# Patient Record
Sex: Female | Born: 1965 | Race: White | Hispanic: No | Marital: Married | Smoking: Current every day smoker
Health system: Southern US, Community
[De-identification: ages and names within clinical notes are randomized; demographics above are authoritative.]

## PROBLEM LIST (undated history)

## (undated) DIAGNOSIS — I6523 Occlusion and stenosis of bilateral carotid arteries: Secondary | ICD-10-CM

## (undated) DIAGNOSIS — F429 Obsessive-compulsive disorder, unspecified: Secondary | ICD-10-CM

## (undated) HISTORY — DX: Obsessive-compulsive disorder, unspecified: F42.9

## (undated) HISTORY — PX: TONSILECTOMY, ADENOIDECTOMY, BILATERAL MYRINGOTOMY AND TUBES: SHX2538

## (undated) HISTORY — PX: APPENDECTOMY: SHX54

## (undated) HISTORY — PX: OTHER SURGICAL HISTORY: SHX169

## (undated) HISTORY — DX: Occlusion and stenosis of bilateral carotid arteries: I65.23

## (undated) HISTORY — PX: PARTIAL HYSTERECTOMY: SHX80

---

## 2016-03-10 DIAGNOSIS — M25559 Pain in unspecified hip: Secondary | ICD-10-CM | POA: Insufficient documentation

## 2016-03-10 DIAGNOSIS — M7071 Other bursitis of hip, right hip: Secondary | ICD-10-CM | POA: Insufficient documentation

## 2016-03-10 DIAGNOSIS — M7072 Other bursitis of hip, left hip: Secondary | ICD-10-CM | POA: Insufficient documentation

## 2016-08-15 ENCOUNTER — Other Ambulatory Visit (INDEPENDENT_AMBULATORY_CARE_PROVIDER_SITE_OTHER): Payer: Self-pay

## 2016-08-15 ENCOUNTER — Ambulatory Visit (INDEPENDENT_AMBULATORY_CARE_PROVIDER_SITE_OTHER): Payer: 59

## 2016-08-15 ENCOUNTER — Ambulatory Visit (INDEPENDENT_AMBULATORY_CARE_PROVIDER_SITE_OTHER): Payer: 59 | Admitting: Orthopaedic Surgery

## 2016-08-15 DIAGNOSIS — M25552 Pain in left hip: Secondary | ICD-10-CM | POA: Diagnosis not present

## 2016-08-15 DIAGNOSIS — M25551 Pain in right hip: Secondary | ICD-10-CM

## 2016-08-15 NOTE — Progress Notes (Signed)
Office Visit Note   Patient: Mercedes Stevens           Date of Birth: 24-Aug-1965           MRN: [id number] Visit Date: 08/15/2016              Requested by: No referring provider defined for this encounter. PCP: Patient, No Pcp Per   Assessment & Plan: Visit Diagnoses:  1. Bilateral hip pain     Plan:Given her failure of all forms conservative treatment and the fact this is worsening and MRI is warranted to assess the gluteus medius and minimus tendons around both hips. I'm concerned about a cortical irregularities in for multiple steroid injections that she does have some chronic insertional tendinitis or even partial tearing to full tearing that will need to be addressed. I showed her hip model and explained in detail what this involves and we went over x-rays. I do feel an MRI is warranted given the long treatment that she has had in the significant failure of the she's had at this point. All questions were encouraged and answered. We'll see her back after the MRIs are obtained.  Follow-Up Instructions: Return in about 3 weeks (around 09/05/2016).   Orders:  Orders Placed This Encounter  Procedures  . XR HIPS BILAT W OR W/O PELVIS 2V   No orders of the defined types were placed in this encounter.     Procedures: No procedures performed   Clinical Data: No additional findings.   Subjective: No chief complaint on file. The patient comes in today for second opinion as a relates to her hips. 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 She's had numerous injections in both hips and skin were pain is daily and is detrimentally affecting her activities daily living, her quality of life, and her mobility. She's been to anti-inflammatories and physical therapy as well as weight loss, activity modification, rest, ice, heat and time. She denies any pain in the groin. Her pain is severe over the trochanteric area on both  sides.  HPI  Review of Systems She currently denies any headache, chest pain, short of breath, fever, chills, nausea, vomiting.  Objective: Vital Signs: There were no vitals taken for this visit.  Physical Exam She is alert or 3 and in no acute distress Ortho Exam Examination of both hip shows significant pain of the trochanteric area on both hips. Even stressing the hip abductors and abductors causes pain of the trochanteric areas. Her hips otherwise moves smoothly and the ball-and-socket area. Specialty Comments:  No specialty comments available.  Imaging: Xr Hips Bilat W Or W/o Pelvis 2v  Result Date: 08/15/2016 An AP pelvis lateral both hips show significant cortical irregularities around the insertion of the gluteus medius and minimus of the trochanteric areas on both sides. Both hips are well located otherwise with no significant arthritic changes in the hip joints themselves.    PMFS History: Patient Active Problem List   Diagnosis Date Noted  . Bilateral hip pain 08/15/2016   No past medical history on file.  No family history on file.  No past surgical history on file. Social History   Occupational History  . Not on file.   Social History Main Topics  . Smoking status: Not on file  . Smokeless tobacco: Not on file  . Alcohol use Not on file  . Drug use: Unknown  . Sexual activity: Not on  file

## 2016-08-27 ENCOUNTER — Other Ambulatory Visit: Payer: Self-pay

## 2016-08-27 ENCOUNTER — Ambulatory Visit
Admission: RE | Admit: 2016-08-27 | Discharge: 2016-08-27 | Disposition: A | Discharge status: Home / Self Care | Payer: Managed Care, Other (non HMO) | Source: Ambulatory Visit | Attending: Orthopaedic Surgery | Admitting: Orthopaedic Surgery

## 2016-08-27 DIAGNOSIS — M25552 Pain in left hip: Principal | ICD-10-CM

## 2016-08-27 DIAGNOSIS — M25551 Pain in right hip: Secondary | ICD-10-CM

## 2016-09-05 ENCOUNTER — Ambulatory Visit (INDEPENDENT_AMBULATORY_CARE_PROVIDER_SITE_OTHER): Payer: 59 | Admitting: Orthopaedic Surgery

## 2016-09-05 DIAGNOSIS — M7061 Trochanteric bursitis, right hip: Secondary | ICD-10-CM | POA: Diagnosis not present

## 2016-09-05 DIAGNOSIS — M25552 Pain in left hip: Secondary | ICD-10-CM

## 2016-09-05 DIAGNOSIS — M25551 Pain in right hip: Secondary | ICD-10-CM | POA: Diagnosis not present

## 2016-09-05 NOTE — Progress Notes (Signed)
The patient is following up after MRI of her right hip. She has severe chronic pain for 10 years in that hip. She had multiple injections over the years as well. Prograf on exam she still has severe pain over the trochanteric area of her right hip. MRI shows extensive gluteus medius and minimus tendinosis with partial tearing of his tendons and there is a large fluid collection around the trochanteric area consistent with chronic trochanter bursitis. There is minimal thinning of the cartilage of her hip and minimal degenerative tearing of the labrum but her pain is more consistent on the lateral aspect with her bursitis.  At this point I am recommending trochanteric bursectomy of the right hip as well as debridement of the gluteus medius and minimus tendons in this area. I showed her hip model and explained in detail what this involves and given the chronicity of her pain in severity of that she does wish proceed with this type of surgery. We long and thorough discussion of the risk and benefits surgery as well and she does wish to proceed. We would then see her back in 2 weeks postoperative for wound assessment. All questions were encouraged and answered.

## 2016-09-08 ENCOUNTER — Encounter: Payer: Self-pay | Admitting: Orthopaedic Surgery

## 2016-09-08 DIAGNOSIS — M7061 Trochanteric bursitis, right hip: Secondary | ICD-10-CM | POA: Diagnosis not present

## 2016-09-22 ENCOUNTER — Ambulatory Visit (INDEPENDENT_AMBULATORY_CARE_PROVIDER_SITE_OTHER): Payer: Managed Care, Other (non HMO) | Admitting: Orthopaedic Surgery

## 2016-09-22 DIAGNOSIS — M7061 Trochanteric bursitis, right hip: Secondary | ICD-10-CM

## 2016-09-22 MED ORDER — HYDROCODONE-ACETAMINOPHEN 5-325 MG PO TABS: 1.0000 | ORAL_TABLET | Freq: Four times a day (QID) | ORAL | 0 refills | Status: DC | PRN

## 2016-09-22 NOTE — Progress Notes (Signed)
The patient is now 2 weeks status post a right hip trochanteric bursectomy. Without significant cartilage calcifications around her trochanteric tissue due to multiple injections over the years. We able to remove the calcified tissue and then opened the iliotibial band. We found significant and severe tendinosis of the gluteus medius and minimus tendons which we debrided significantly. I was able to remove sharp edges of bone as well and perform a bursectomy. We then performed a Z lengthening of the iliotibial band tissue so we could bring this back together loosely. She is doing well postoperatively other than expected pain.  On examination her incisions well-healed there is significant bruising and swelling around this area which is to be expected. There is no evidence of infection.  We talked in detail about her surgery. On her to stay off of that hip in terms of lying on that side for an extended period of time. I like to keep her out of work until early September 17 to allow for healing. I did refill her hydrocodone and she is to work on alternating ice and heat to this area as well. All questions were encouraged and answered. We'll see her back in 4 weeks.

## 2016-10-02 ENCOUNTER — Encounter (INDEPENDENT_AMBULATORY_CARE_PROVIDER_SITE_OTHER): Payer: Self-pay | Admitting: Orthopaedic Surgery

## 2016-10-03 ENCOUNTER — Telehealth (INDEPENDENT_AMBULATORY_CARE_PROVIDER_SITE_OTHER): Payer: Self-pay

## 2016-10-03 ENCOUNTER — Encounter (INDEPENDENT_AMBULATORY_CARE_PROVIDER_SITE_OTHER): Payer: Self-pay

## 2016-10-03 NOTE — Telephone Encounter (Signed)
Patient called stating that she is having burning and pinching in her right hip for about 3 days and it's starting to cause her pain.  Would  like to know if she needs to be seen? CB# is [phone].  Please advise.  Thank you.

## 2016-10-03 NOTE — Telephone Encounter (Signed)
I do feel that we should see her sometime in the next week or 2. This can be normal type of sensation she's feeling have often seen this soon after hip replacement surgery and it should subside. I don't mind seeing her though is to check things out.

## 2016-10-03 NOTE — Telephone Encounter (Signed)
See note, pls advise 

## 2016-10-04 NOTE — Telephone Encounter (Signed)
It was a bursectomy, not a replacement. Still same symptoms?

## 2016-10-04 NOTE — Telephone Encounter (Signed)
Never mind then.  This still can be normal after a bursectomy - had her confused for another patient

## 2016-10-10 ENCOUNTER — Ambulatory Visit (INDEPENDENT_AMBULATORY_CARE_PROVIDER_SITE_OTHER): Payer: Managed Care, Other (non HMO) | Admitting: Orthopaedic Surgery

## 2016-10-10 DIAGNOSIS — M7061 Trochanteric bursitis, right hip: Secondary | ICD-10-CM

## 2016-10-10 MED ORDER — DOXYCYCLINE HYCLATE 100 MG PO TABS: 100.0000 mg | ORAL_TABLET | Freq: Two times a day (BID) | ORAL | 0 refills | Status: DC

## 2016-10-10 MED ORDER — HYDROCODONE-ACETAMINOPHEN 5-325 MG PO TABS: 1.0000 | ORAL_TABLET | Freq: Three times a day (TID) | ORAL | 0 refills | Status: DC | PRN

## 2016-10-10 MED ORDER — FLUCONAZOLE 150 MG PO TABS: 150.0000 mg | ORAL_TABLET | Freq: Once | ORAL | 0 refills | Status: AC

## 2016-10-10 MED ORDER — METHYLPREDNISOLONE 4 MG PO TABS: ORAL_TABLET | ORAL | 0 refills | Status: DC

## 2016-10-10 NOTE — Progress Notes (Signed)
   Office Visit Note   Patient: Mercedes Stevens           Date of Birth: 1965/09/22           MRN: [id number] Visit Date: 10/10/2016              Requested by: No referring provider defined for this encounter. PCP: Patient, No Pcp Per   Assessment & Plan: Visit Diagnoses:  1. Trochanteric bursitis, right hip     Plan: I still feel this is chronic inflammation and pain. I did refill her hydrocodone but I will at least try 2 weeks of an antibiotic as well as a steroid taper. I'll send his into her pharmacy. I like to reevaluate her in 2 weeks.  Follow-Up Instructions: Return in about 2 weeks (around 10/24/2016), or if symptoms worsen or fail to improve.   Orders:  No orders of the defined types were placed in this encounter.  Meds ordered this encounter  Medications  . HYDROcodone-acetaminophen (NORCO/VICODIN) 5-325 MG tablet    Sig: Take 1-2 tablets by mouth 3 (three) times daily as needed for moderate pain.    Dispense:  60 tablet    Refill:  0  . methylPREDNISolone (MEDROL) 4 MG tablet    Sig: Medrol dose pack. Take as instructed    Dispense:  21 tablet    Refill:  0  . doxycycline (VIBRA-TABS) 100 MG tablet    Sig: Take 1 tablet (100 mg total) by mouth 2 (two) times daily.    Dispense:  30 tablet    Refill:  0  . fluconazole (DIFLUCAN) 150 MG tablet    Sig: Take 1 tablet (150 mg total) by mouth once.    Dispense:  1 tablet    Refill:  0      Procedures: No procedures performed   Clinical Data: No additional findings.   Subjective: Chief Complaint  Patient presents with  . Right Hip - Follow-up  The patient is status post a right hip trochanteric bursectomy. She still having continued pain in this area. She had chronic pain in that hip for some time and we did found significant calcifications are on her bursa for multiple injections elsewhere. She still having significant pain over trochanteric area.  HPI  Review of Systems She currently denies any fever  chills  Objective: Vital Signs: There were no vitals taken for this visit.  Physical Exam She is alert or 3 and in no acute distress Ortho Exam Examination of her right hip incision appears to be healing well. There is some slight redness around about don't think it's infection of the great may be where she is rubbing on this area. I try to place an injection in the area and got no fluid. There is no drainage Specialty Comments:  No specialty comments available.  Imaging: No results found.   PMFS History: Patient Active Problem List   Diagnosis Date Noted  . Trochanteric bursitis, right hip 09/05/2016  . Bilateral hip pain 08/15/2016   No past medical history on file.  No family history on file.  No past surgical history on file. Social History   Occupational History  . Not on file.   Social History Main Topics  . Smoking status: Not on file  . Smokeless tobacco: Not on file  . Alcohol use Not on file  . Drug use: Unknown  . Sexual activity: Not on file

## 2016-10-11 ENCOUNTER — Telehealth (INDEPENDENT_AMBULATORY_CARE_PROVIDER_SITE_OTHER): Payer: Self-pay | Admitting: Orthopaedic Surgery

## 2016-10-11 NOTE — Telephone Encounter (Signed)
8/30 & 9/17 OV NOTES FAXED TO [employer] [phone]

## 2016-10-20 ENCOUNTER — Ambulatory Visit (INDEPENDENT_AMBULATORY_CARE_PROVIDER_SITE_OTHER): Payer: Managed Care, Other (non HMO) | Admitting: Orthopaedic Surgery

## 2016-10-20 ENCOUNTER — Ambulatory Visit (INDEPENDENT_AMBULATORY_CARE_PROVIDER_SITE_OTHER): Payer: Managed Care, Other (non HMO) | Admitting: Physician Assistant

## 2016-10-20 ENCOUNTER — Telehealth (INDEPENDENT_AMBULATORY_CARE_PROVIDER_SITE_OTHER): Payer: Self-pay | Admitting: Orthopaedic Surgery

## 2016-10-20 DIAGNOSIS — M7061 Trochanteric bursitis, right hip: Secondary | ICD-10-CM

## 2016-10-20 NOTE — Progress Notes (Signed)
   Office Visit Note   Patient: Mercedes Stevens           Date of Birth: [birth year]           MRN: [id number] Visit Date: 10/20/2016              Requested by: No referring provider defined for this encounter. PCP: Patient, No Pcp Per   Assessment & Plan: Visit Diagnoses:  1. Trochanteric bursitis, right hip     Plan: She is able to return to work full duties on Monday, October 1. Like for her to continue to not lie on the right hip for at least another month. She is to finish her doxycycline. Wash the incision site with an antibacterial soap daily. We'll see her back in a month to this for wound check and see how she is doing overall.  Follow-Up Instructions: Return in about 4 weeks (around 11/17/2016) for post op, wound check.   Orders:  No orders of the defined types were placed in this encounter.  No orders of the defined types were placed in this encounter.     Procedures: No procedures performed   Clinical Data: No additional findings.   Subjective: Chief Complaint  Patient presents with  . Right Hip - Pain, Follow-up    HPI Ms.Ringler returns today follow-up of her right hip status post trochanteric bursectomy is now approximately 6 weeks. She states overall that she is trending towards improvement. She is on doxycycline. She is asking about work on Monday will duties.  Review of Systems Fevers or chills.  Objective: Vital Signs: There were no vitals taken for this visit.  Physical Exam  Ortho Exam Right hip incision well-healed. No signs of gross infection. She has some slight splotchy around fine as is some erythema but no significant warmth. No drainage. Specialty Comments:  No specialty comments available.  Imaging: No results found.   PMFS History: Patient Active Problem List   Diagnosis Date Noted  . Trochanteric bursitis, right hip 09/05/2016  . Bilateral hip pain 08/15/2016   No past medical history on file.  No family history on file.    No past surgical history on file. Social History   Occupational History  . Not on file.   Social History Main Topics  . Smoking status: Not on file  . Smokeless tobacco: Not on file  . Alcohol use Not on file  . Drug use: Unknown  . Sexual activity: Not on file

## 2016-10-20 NOTE — Telephone Encounter (Signed)
10/20/2016 WORK NOTE FAXED TO EMPLOYER [phone]/EMPLOYEE ID [id number]

## 2016-11-17 ENCOUNTER — Telehealth (INDEPENDENT_AMBULATORY_CARE_PROVIDER_SITE_OTHER): Payer: Self-pay | Admitting: Orthopaedic Surgery

## 2016-11-17 ENCOUNTER — Ambulatory Visit (INDEPENDENT_AMBULATORY_CARE_PROVIDER_SITE_OTHER): Payer: Managed Care, Other (non HMO) | Admitting: Physician Assistant

## 2016-11-17 NOTE — Telephone Encounter (Signed)
error 

## 2016-11-21 ENCOUNTER — Ambulatory Visit (INDEPENDENT_AMBULATORY_CARE_PROVIDER_SITE_OTHER): Payer: Managed Care, Other (non HMO) | Admitting: Physician Assistant

## 2016-11-21 ENCOUNTER — Telehealth (INDEPENDENT_AMBULATORY_CARE_PROVIDER_SITE_OTHER): Payer: Self-pay | Admitting: Orthopaedic Surgery

## 2016-11-21 DIAGNOSIS — M7062 Trochanteric bursitis, left hip: Secondary | ICD-10-CM

## 2016-11-21 DIAGNOSIS — M7061 Trochanteric bursitis, right hip: Secondary | ICD-10-CM

## 2016-11-21 MED ORDER — METHYLPREDNISOLONE ACETATE 40 MG/ML IJ SUSP
40.0000 mg | INTRAMUSCULAR | Status: AC | PRN
  Administered 2016-11-21: 40 mg via INTRA_ARTICULAR

## 2016-11-21 MED ORDER — LIDOCAINE HCL 1 % IJ SOLN
3.0000 mL | INTRAMUSCULAR | Status: AC | PRN
  Administered 2016-11-21: 3 mL

## 2016-11-21 NOTE — Telephone Encounter (Signed)
[place] fax[place] [employer] [place] forms [place] 09[place]19/[place] [phone]

## 2016-11-21 NOTE — Progress Notes (Signed)
   Procedure Note  Patient: Mercedes Stevens             Date of Birth: [birth year]           MRN: [id number]             Visit Date: 11/21/2016 HPI Roe CoombsDon returns today for follow-up of her right hip which she had a bursectomy on in August this year.  States right hip is doing well.  She is wondering if an MRI should be done of her left hip to check the degree of bursitis she has.  She still having pain left hip despite the multiple injections over an 8-year.  In trying stretching exercises.  I did review with her that her MRI of her pelvis which was done in August of this year showed mild tendinosis of the gluteus medius and minimus.  Plan Procedures: Visit Diagnoses: Trochanteric bursitis, right hip  Trochanteric bursitis, left hip  Large Joint Inj Date/Time: 11/21/2016 4:30 PM Performed by: Kirtland BouchardLARK, Anjana Cheek W Authorized by: Kirtland BouchardLARK, Jayshon Dommer W   Consent Given by:  Patient Indications:  Pain Location:  Hip Site:  L greater trochanter Needle Size:  22 G Needle Length:  1.5 inches Approach:  Lateral Ultrasound Guidance: No   Fluoroscopic Guidance: No   Arthrogram: No   Medications:  40 mg methylPREDNISolone acetate 40 MG/ML; 3 mL lidocaine 1 % Aspiration Attempted: No   Patient tolerance:  Patient tolerated the procedure well with no immediate complications     Plan:W she may benefit from e will see her back in 1 month to check to see how she responded to the injection in the left hip.  It did show some IT band stretching exercises.  Questions were encouraged and answered at length.  Left hip bursectomy MEASURES fail.

## 2016-12-22 ENCOUNTER — Encounter (INDEPENDENT_AMBULATORY_CARE_PROVIDER_SITE_OTHER): Payer: Self-pay | Admitting: Physician Assistant

## 2016-12-22 ENCOUNTER — Ambulatory Visit (INDEPENDENT_AMBULATORY_CARE_PROVIDER_SITE_OTHER): Payer: Managed Care, Other (non HMO) | Admitting: Physician Assistant

## 2016-12-22 DIAGNOSIS — M7062 Trochanteric bursitis, left hip: Secondary | ICD-10-CM

## 2016-12-22 NOTE — Progress Notes (Signed)
Mercedes Stevens returns today for follow-up of her left hip trochanteric bursitis.  She states the injection helped wonderfully for about 2 weeks and her pains came back to some degree with non-Hispanic is what she was having on the right hip prior to undergoing right hip trochanteric bursectomy.  She had no real radicular symptoms down the left leg.  She states she really has not been doing the IT band stretching exercises states she just is.  Taking ibuprofen for the pain has helped some.  Review of systems: See HPI otherwise negative  Physical exam: Ambulates without any assistive devices.  Left hip she has discomfort with external rotation.  No pain with internal rotation.  Motion is fluid though internal and external rotation left hip.  Tenderness over the left trochanteric region.  Impression: Left hip trochanteric bursitis  Plan: At this point time recommend she try the IT band stretching at least every other day.  Continue with NSAIDs.  Do not feel that formal therapy would help due to the fact that she does not do her home exercises.  Therefore recommended she try some deep tissue massage therapy possible.  Did not perform another cortisone injection until mid to late January.  She will follow-up as needed.

## 2017-01-09 ENCOUNTER — Ambulatory Visit (INDEPENDENT_AMBULATORY_CARE_PROVIDER_SITE_OTHER): Payer: Managed Care, Other (non HMO) | Admitting: Orthopaedic Surgery

## 2017-01-09 ENCOUNTER — Encounter (INDEPENDENT_AMBULATORY_CARE_PROVIDER_SITE_OTHER): Payer: Self-pay | Admitting: Orthopaedic Surgery

## 2017-01-09 ENCOUNTER — Telehealth (INDEPENDENT_AMBULATORY_CARE_PROVIDER_SITE_OTHER): Payer: Self-pay | Admitting: Orthopaedic Surgery

## 2017-01-09 DIAGNOSIS — M7061 Trochanteric bursitis, right hip: Secondary | ICD-10-CM

## 2017-01-09 MED ORDER — METHYLPREDNISOLONE ACETATE 40 MG/ML IJ SUSP
40.0000 mg | INTRAMUSCULAR | Status: AC | PRN
  Administered 2017-01-09: 40 mg via INTRA_ARTICULAR

## 2017-01-09 MED ORDER — LIDOCAINE HCL 1 % IJ SOLN
3.0000 mL | INTRAMUSCULAR | Status: AC | PRN
  Administered 2017-01-09: 3 mL

## 2017-01-09 NOTE — Telephone Encounter (Signed)
She can have tylenol #3, 1-.2 ever 8 hours as needed, #60

## 2017-01-09 NOTE — Telephone Encounter (Signed)
Patient called asking for Vicodin to hold her over for the next few days. CB #  [phone]

## 2017-01-09 NOTE — Progress Notes (Signed)
Office Visit Note   Patient: Mercedes Stevens           Date of Birth: [birth year]           MRN: [id number] Visit Date: 01/09/2017              Requested by: No referring provider defined for this encounter. PCP: Patient, No Pcp Per   Assessment & Plan: Visit Diagnoses:  1. Trochanteric bursitis, right hip     Plan: She would like to have a steroid injection in her trochanteric area today and I agree with this.  She is far enough out from her surgery.  She understands the risk and benefits of these injections having had them in the past.  She knows to still continue strengthening exercises.  She is in a look for less demand type of job.  I agree with this as well.  She tolerated steroid injection well.  All questions and concerns were answered and addressed.  She will follow-up as needed.  Follow-Up Instructions: Return if symptoms worsen or fail to improve.   Orders:  No orders of the defined types were placed in this encounter.  No orders of the defined types were placed in this encounter.     Procedures: Large Joint Inj: R greater trochanter on 01/09/2017 11:44 AM Indications: pain and diagnostic evaluation Details: 22 G 1.5 in needle, lateral approach  Arthrogram: No  Medications: 3 mL lidocaine 1 %; 40 mg methylPREDNISolone acetate 40 MG/ML Outcome: tolerated well, no immediate complications Procedure, treatment alternatives, risks and benefits explained, specific risks discussed. Consent was given by the patient. Immediately prior to procedure a time out was called to verify the correct patient, procedure, equipment, support staff and site/side marked as required. Patient was prepped and draped in the usual sterile fashion.       Clinical Data: No additional findings.   Subjective: Chief Complaint  Patient presents with  . Left Hip - Pain  The patient's actual chief complaint today is right hip pain.  She has had a trochanteric bursectomy on the right side in  August of this year and done exceptionally well.  She is been working recently and after getting up from the floor she felt severe pain over trochanteric area.  She like to consider steroid injection in the right hip today over the trochanteric area.  HPI  Review of Systems She currently denies any fever, chills, nausea, vomiting, chest pain, shortness of breath  Objective: Vital Signs: There were no vitals taken for this visit.  Physical Exam She is alert and oriented x3 and in no acute distress Ortho Exam Examination right hip shows full range of motion with no pain except on the extremes of rotation only over the trochanteric area.  Incisions well-healed.  Her pain is just posterior to the injection area.  Specialty Comments:  No specialty comments available.  Imaging: No results found.   PMFS History: Patient Active Problem List   Diagnosis Date Noted  . Trochanteric bursitis, right hip 09/05/2016  . Bilateral hip pain 08/15/2016   No past medical history on file.  No family history on file.  No past surgical history on file. Social History   Occupational History  . Not on file  Tobacco Use  . Smoking status: Never Smoker  . Smokeless tobacco: Never Used  Substance and Sexual Activity  . Alcohol use: Not on file  . Drug use: Not on file  . Sexual activity: Not on file

## 2017-01-09 NOTE — Telephone Encounter (Signed)
Please advise 

## 2017-01-10 ENCOUNTER — Other Ambulatory Visit (INDEPENDENT_AMBULATORY_CARE_PROVIDER_SITE_OTHER): Payer: Self-pay

## 2017-01-10 MED ORDER — ACETAMINOPHEN-CODEINE #3 300-30 MG PO TABS: 1.0000 | ORAL_TABLET | Freq: Three times a day (TID) | ORAL | 0 refills | Status: DC | PRN

## 2017-01-10 NOTE — Telephone Encounter (Signed)
Called into pharmacy

## 2017-01-31 ENCOUNTER — Other Ambulatory Visit (INDEPENDENT_AMBULATORY_CARE_PROVIDER_SITE_OTHER): Payer: Self-pay | Admitting: Orthopaedic Surgery

## 2017-01-31 NOTE — Telephone Encounter (Signed)
Please advise 

## 2017-01-31 NOTE — Telephone Encounter (Signed)
Called into pharmacy

## 2017-08-16 NOTE — Progress Notes (Signed)
[facility] Pulmonary Medicine Consultation      Assessment and Plan:  Dyspnea on exertion. -Likely multifactorial, smoking, possible asthma. -Patient's spirometry today did not show evidence of COPD, obstructive lung disease.  Symptoms are most likely secondary to smoking, deconditioning, and possibly mild asthma. -Patient is started empirically on Advair, she is strongly urged to quit smoking as this is likely the major driver of her dyspnea.  Back pain. - Patient has bands of pain going across her back, she was reassured that this is not of the lung origin.  However she needs to see her PCP as this may be due to some other cause such as degenerative joint disease.  Excessive daytime sleepiness. - Symptoms and signs of obstructive sleep apnea, will send for sleep study.  Nicotine abuse. - Discussed importance of smoke cessation, spent 3 minutes in discussion. - She does not think she is yet ready to try to quit smoking she feels she is very heavily addicted to nicotine at this time.  Orders Placed This Encounter  Procedures  . DG Chest 2 View  . Spirometry with Graph  . Home sleep test   Meds ordered this encounter  Medications  . Fluticasone-Salmeterol (ADVAIR DISKUS) 250-50 MCG/DOSE AEPB    Sig: Inhale 1 puff into the lungs 2 (two) times daily. Rinse mouth after use.    Dispense:  60 each    Refill:  5   Return in about 8 weeks (around 10/12/2017).   Date: 08/17/2017  MRN# [id number] Mercedes Stevens 1965-12-20    Mercedes Stevens is a 52 y.o. old female seen in consultation for chief complaint of:    Chief Complaint  Patient presents with  . Consult    Self referral: pt complain of waking up from sleep with pain in back.  . Cough    clear to white thick am  . Shortness of Breath    with exertion    HPI:   She notices recently that she has her family were blowing up toys and she could not do it. She also feels that she has bands of pain across her back, she takes  motrin.  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 She has not yet seen her PCP about her back pain because the office is far away.  She has trouble breathing with climbing stairs, she will have to stop and catch her breath, this has been going on for several month.  She has never been diagnosed with asthma or COPD.  She has been smoking since the age of 87, she has for 9 months in the past when she was pregnant. She has not quit since that time. She is smoking 1-2 ppd.  She has 2 dogs and a cat, they sleep in bed with her.  Goes to bed at 630 pm, watches tv until falling asleep at 830, wakes at 4. When wakes in am still feels tired, she feels that she can sleep at any time. She has been told that she snores.   **Spirometry 08/17/2017>> tracings personally reviewed; FVC is 114% predicted, FEV1 is 105% predicted, ratio 80%.  Overall this test shows normal pulmonary functions without evidence of COPD/emphysema.  PMHX:   History reviewed. No pertinent past medical history. Surgical Hx:  History reviewed. No pertinent surgical history. Family Hx:  Family History  Problem Relation Age of Onset  . Heart attack Mother   . Hypertension Mother   . Hyperlipidemia Mother   .  Kidney disease Maternal Aunt    Social Hx:   Social History   Tobacco Use  . Smoking status: Current Every Day Smoker    Packs/day: 2.00    Years: 28.00    Pack years: 56.00    Types: Cigarettes  . Smokeless tobacco: Never Used  Substance Use Topics  . Alcohol use: Not Currently  . Drug use: Never   Medication:    Current Outpatient Medications:  .  estradiol (ESTRACE) 1 MG tablet, 1 TAB(S) ONCE A DAY FOR 3 WEEKS, THEN HOLD FOR 1 WEEK ORALLY 30 DAY(S), Disp: , Rfl: 2 .  oxybutynin (DITROPAN-XL) 10 MG 24 hr tablet, Take 20 mg by mouth daily., Disp: , Rfl: 3   Allergies:  Oxycodone-acetaminophen  Review of Systems: Gen:  Denies  fever, sweats, chills HEENT: Denies blurred vision,  double vision. bleeds, sore throat Cvc:  No dizziness, chest pain. Resp:   Denies cough or sputum production, hemoptysis Gi: Denies swallowing difficulty, stomach pain. Gu:  Denies bladder incontinence, burning urine Ext:   No Joint pain, stiffness. Skin: No skin rash,  hives  Endoc:  No polyuria, polydipsia. Psych: No depression, insomnia. Other:  All other systems were reviewed with the patient and were negative other that what is mentioned in the HPI.   Physical Examination:   VS: BP 110/80 (BP Location: Left Arm, Cuff Size: Normal)   Pulse 80   Resp 16   Ht 4\' 10"  (1.473 m)   Wt 147 lb (66.7 kg)   SpO2 96%   BMI 30.72 kg/m   General Appearance: No distress  Neuro:without focal findings,  speech normal,  HEENT: PERRLA, EOM intact.   Pulmonary: normal breath sounds, No wheezing.  CardiovascularNormal S1,S2.  No m/r/g.   Abdomen: Benign, Soft, non-tender. Renal:  No costovertebral tenderness  GU:  No performed at this time. Endoc: No evident thyromegaly, no signs of acromegaly. Skin:   warm, no rashes, no ecchymosis  Extremities: normal, no cyanosis, clubbing.  Other findings:    LABORATORY PANEL:   CBC No results for input(s): WBC, HGB, HCT, PLT in the last 168 hours. ------------------------------------------------------------------------------------------------------------------  Chemistries  No results for input(s): NA, K, CL, CO2, GLUCOSE, BUN, CREATININE, CALCIUM, MG, AST, ALT, ALKPHOS, BILITOT in the last 168 hours.  Invalid input(s): GFRCGP ------------------------------------------------------------------------------------------------------------------  Cardiac Enzymes No results for input(s): TROPONINI in the last 168 hours. ------------------------------------------------------------  RADIOLOGY:  No results found.     Thank  you for the consultation and for allowing [facility] to assist in the care of your patient. Our  recommendations are noted above.  Please contact us if we can be of further service.   Wells Guileseep Roniqua Kintz, M.D., F.C.C.P.  Board Certified in Internal Medicine, Pulmonary Medicine, Critical Care Medicine, and Sleep Medicine.  [facility] Office Number: [phone]   08/17/2017.

## 2017-08-17 ENCOUNTER — Ambulatory Visit
Admission: RE | Admit: 2017-08-17 | Discharge: 2017-08-17 | Disposition: A | Discharge status: Home / Self Care | Payer: 59 | Source: Ambulatory Visit | Attending: Internal Medicine | Admitting: Internal Medicine

## 2017-08-17 ENCOUNTER — Encounter: Payer: Self-pay | Admitting: Internal Medicine

## 2017-08-17 ENCOUNTER — Ambulatory Visit: Payer: 59 | Admitting: Internal Medicine

## 2017-08-17 VITALS — BP 110/80 | HR 80 | Resp 16 | Ht <= 58 in | Wt 147.0 lb

## 2017-08-17 DIAGNOSIS — R05 Cough: Secondary | ICD-10-CM | POA: Insufficient documentation

## 2017-08-17 DIAGNOSIS — G4719 Other hypersomnia: Secondary | ICD-10-CM | POA: Insufficient documentation

## 2017-08-17 DIAGNOSIS — R0609 Other forms of dyspnea: Secondary | ICD-10-CM | POA: Insufficient documentation

## 2017-08-17 DIAGNOSIS — R059 Cough, unspecified: Secondary | ICD-10-CM

## 2017-08-17 DIAGNOSIS — Z0000 Encounter for general adult medical examination without abnormal findings: Secondary | ICD-10-CM | POA: Diagnosis not present

## 2017-08-17 MED ORDER — FLUTICASONE-SALMETEROL 250-50 MCG/DOSE IN AEPB: 1.0000 | INHALATION_SPRAY | Freq: Two times a day (BID) | RESPIRATORY_TRACT | 5 refills | Status: DC

## 2017-08-17 NOTE — Patient Instructions (Addendum)
Will send for sleep study, chest xray.  Will start advair inhaler one puff twice daily, rinse mouth after use.  Will refer you to a PCP.    --Quitting smoking is the most important thing that you can do for your health.  --Quitting smoking will have greater affect on your health than any medicine that we can give you.    Sleep Apnea    Sleep apnea is disorder that affects a person's sleep. A person with sleep apnea has abnormal pauses in their breathing when they sleep. It is hard for them to get a good sleep. This makes a person tired during the day. It also can lead to other physical problems. There are three types of sleep apnea. One type is when breathing stops for a short time because your airway is blocked (obstructive sleep apnea). Another type is when the brain sometimes fails to give the normal signal to breathe to the muscles that control your breathing (central sleep apnea). The third type is a combination of the other two types.  HOME CARE   Take all medicine as told by your doctor.  Avoid alcohol, calming medicines (sedatives), and depressant drugs.  Try to lose weight if you are overweight. Talk to your doctor about a healthy weight goal.  Your doctor may have you use a device that helps to open your airway. It can help you get the air that you need. It is called a positive airway pressure (PAP) device.   MAKE SURE YOU:   Understand these instructions.  Will watch your condition.  Will get help right away if you are not doing well or get worse.  It may take approximately 1 month for you to get used to wearing her CPAP every night.  Be sure to work with your machine to get used to it, be patient, it may take time!  If you have trouble tolerating CPAP DO NOT RETURN YOUR MACHINE; Contact our office to see if we can help you tolerate the CPAP better first!

## 2017-10-19 ENCOUNTER — Telehealth: Payer: Self-pay | Admitting: Internal Medicine

## 2017-10-19 NOTE — Telephone Encounter (Signed)
Patient decided not to do a sleep study and wants to know If 2 m fu is needed or if she can fu as needed .    Please advise scheduling.  If not needed patient request delete recall .

## 2017-10-19 NOTE — Telephone Encounter (Signed)
Spoke with patient, she stated she does not want to proceed with sleep study at this time. Also stated she will follow up with [place] as needed. No further questions at this time.

## 2017-12-04 ENCOUNTER — Telehealth: Payer: Self-pay | Admitting: Internal Medicine

## 2017-12-04 NOTE — Telephone Encounter (Signed)
Patient doesn't need fu per request deleting recall.

## 2018-04-26 ENCOUNTER — Encounter (INDEPENDENT_AMBULATORY_CARE_PROVIDER_SITE_OTHER): Payer: Self-pay | Admitting: Orthopaedic Surgery

## 2018-04-26 ENCOUNTER — Ambulatory Visit (INDEPENDENT_AMBULATORY_CARE_PROVIDER_SITE_OTHER): Payer: 59 | Admitting: Orthopaedic Surgery

## 2018-04-26 ENCOUNTER — Other Ambulatory Visit: Payer: Self-pay

## 2018-04-26 DIAGNOSIS — M7062 Trochanteric bursitis, left hip: Secondary | ICD-10-CM | POA: Diagnosis not present

## 2018-04-26 MED ORDER — METHYLPREDNISOLONE ACETATE 40 MG/ML IJ SUSP
40.0000 mg | INTRAMUSCULAR | Status: AC | PRN
  Administered 2018-04-26: 40 mg via INTRA_ARTICULAR

## 2018-04-26 MED ORDER — LIDOCAINE HCL 1 % IJ SOLN
3.0000 mL | INTRAMUSCULAR | Status: AC | PRN
  Administered 2018-04-26: 3 mL

## 2018-04-26 NOTE — Progress Notes (Signed)
   Procedure Note  Patient: Mercedes Stevens             Date of Birth: 03-Jul-1965           MRN: [id number]             Visit Date: 04/26/2018 HPI: Mrs. Guarino comes in today with left hip pain.  She is been seen in the past for right hip trochanteric bursitis back in 2018.  She did well until recently.  She is been having to do a lot of steps at work and feels this may have flared it up.  She is been taking ibuprofen which is causing some GI upset.  She is requesting a left hip trochanteric injection.  She denies any radicular symptoms down the leg.  Has pain in the lateral aspect of the left hip whenever she lies in the hip at night.  She has been doing no stretching exercises.  Physical exam: Left hip excellent range of motion without pain.  She has tenderness directly over the left trochanteric bursa.  There is no rashes skin lesions ulcerations erythema to the left lateral hip.  Procedures: Visit Diagnoses: Trochanteric bursitis of left hip  Large Joint Inj: L greater trochanter on 04/26/2018 8:27 AM Indications: pain Details: 22 G 1.5 in needle, lateral approach  Arthrogram: No  Medications: 3 mL lidocaine 1 %; 40 mg methylPREDNISolone acetate 40 MG/ML Outcome: tolerated well, no immediate complications Procedure, treatment alternatives, risks and benefits explained, specific risks discussed. Consent was given by the patient. Immediately prior to procedure a time out was called to verify the correct patient, procedure, equipment, support staff and site/side marked as required. Patient was prepped and draped in the usual sterile fashion.    Impression: Left hip trochanteric bursitis  Plan: Reviewed IT band stretching exercises with her.  Should follow-up on an as-needed basis pain persist or comes worse.  Questions encouraged and answered

## 2018-05-23 ENCOUNTER — Encounter (INDEPENDENT_AMBULATORY_CARE_PROVIDER_SITE_OTHER): Payer: Self-pay | Admitting: Orthopaedic Surgery

## 2018-08-16 ENCOUNTER — Encounter: Payer: Self-pay | Admitting: Emergency Medicine

## 2018-08-16 ENCOUNTER — Other Ambulatory Visit: Payer: Self-pay

## 2018-08-16 ENCOUNTER — Emergency Department
Admission: EM | Admit: 2018-08-16 | Discharge: 2018-08-16 | Disposition: A | Discharge status: Home / Self Care | Payer: 59 | Attending: Emergency Medicine | Admitting: Emergency Medicine

## 2018-08-16 DIAGNOSIS — Y92410 Unspecified street and highway as the place of occurrence of the external cause: Secondary | ICD-10-CM | POA: Diagnosis not present

## 2018-08-16 DIAGNOSIS — Y9389 Activity, other specified: Secondary | ICD-10-CM | POA: Insufficient documentation

## 2018-08-16 DIAGNOSIS — F17210 Nicotine dependence, cigarettes, uncomplicated: Secondary | ICD-10-CM | POA: Insufficient documentation

## 2018-08-16 DIAGNOSIS — S161XXA Strain of muscle, fascia and tendon at neck level, initial encounter: Secondary | ICD-10-CM | POA: Insufficient documentation

## 2018-08-16 DIAGNOSIS — M25511 Pain in right shoulder: Secondary | ICD-10-CM | POA: Diagnosis not present

## 2018-08-16 DIAGNOSIS — M7918 Myalgia, other site: Secondary | ICD-10-CM

## 2018-08-16 DIAGNOSIS — Z79899 Other long term (current) drug therapy: Secondary | ICD-10-CM | POA: Diagnosis not present

## 2018-08-16 DIAGNOSIS — Y998 Other external cause status: Secondary | ICD-10-CM | POA: Insufficient documentation

## 2018-08-16 DIAGNOSIS — S199XXA Unspecified injury of neck, initial encounter: Secondary | ICD-10-CM | POA: Diagnosis present

## 2018-08-16 MED ORDER — IBUPROFEN 600 MG PO TABS: 600.0000 mg | ORAL_TABLET | Freq: Three times a day (TID) | ORAL | 0 refills | Status: AC | PRN

## 2018-08-16 MED ORDER — TRAMADOL HCL 50 MG PO TABS: 50.0000 mg | ORAL_TABLET | Freq: Two times a day (BID) | ORAL | 0 refills | Status: AC | PRN

## 2018-08-16 MED ORDER — CYCLOBENZAPRINE HCL 10 MG PO TABS: 10.0000 mg | ORAL_TABLET | Freq: Three times a day (TID) | ORAL | 0 refills | Status: DC | PRN
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
## 2018-09-25 ENCOUNTER — Telehealth: Payer: Self-pay | Admitting: Orthopaedic Surgery

## 2018-09-25 NOTE — Telephone Encounter (Signed)
Patient received an injection in April of this year in her left hip and is experiencing pain again and wanted to know if Artis Delay or Dr. Ninfa Linden recommended for her to get another injection.  CB#[phone].  Thank you.

## 2018-09-26 NOTE — Telephone Encounter (Signed)
Can we schedule her for an appt with [place] for an injection please?

## 2018-09-26 NOTE — Telephone Encounter (Signed)
Mercedes Stevens, it will be fine to get her in next week for a hip injection.  It was a troch injection.

## 2018-10-03 ENCOUNTER — Ambulatory Visit (INDEPENDENT_AMBULATORY_CARE_PROVIDER_SITE_OTHER): Payer: 59 | Admitting: Orthopaedic Surgery

## 2018-10-03 ENCOUNTER — Encounter: Payer: Self-pay | Admitting: Orthopaedic Surgery

## 2018-10-03 DIAGNOSIS — M7062 Trochanteric bursitis, left hip: Secondary | ICD-10-CM | POA: Diagnosis not present

## 2018-10-03 MED ORDER — HYDROCODONE-ACETAMINOPHEN 5-325 MG PO TABS: 1.0000 | ORAL_TABLET | Freq: Four times a day (QID) | ORAL | 0 refills | Status: DC | PRN

## 2018-10-03 MED ORDER — METHYLPREDNISOLONE ACETATE 40 MG/ML IJ SUSP
40.0000 mg | INTRAMUSCULAR | Status: AC | PRN
  Administered 2018-10-03: 40 mg via INTRA_ARTICULAR

## 2018-10-03 MED ORDER — LIDOCAINE HCL 1 % IJ SOLN
3.0000 mL | INTRAMUSCULAR | Status: AC | PRN
  Administered 2018-10-03: 3 mL

## 2018-10-03 NOTE — Progress Notes (Signed)
   Procedure Note  Patient: Mercedes Stevens             Date of Birth: Jun 02, 1965           MRN: [id number]             Visit Date: 10/03/2018 HPI: Timmothy Sours comes in today requesting injection for left hip trochanteric bursitis.  She last had injection April this year and did well until about a week ago.  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  She is had no new injury.  She still trying to do some stretching for hip bursitis.  Review of systems: No fevers or chills.  Please see HPI otherwise negative or noncontributory.  Physical exam: Left hip good range of motion without pain.  Tenderness of the left trochanteric region.  No tenderness of the right hip.    Procedures: Visit Diagnoses:  1. Trochanteric bursitis of left hip     Large Joint Inj: L greater trochanter on 10/03/2018 9:42 AM Indications: pain Details: 22 G 1.5 in needle, lateral approach  Arthrogram: No  Medications: 3 mL lidocaine 1 %; 40 mg methylPREDNISolone acetate 40 MG/ML Outcome: tolerated well, no immediate complications Procedure, treatment alternatives, risks and benefits explained, specific risks discussed. Consent was given by the patient. Immediately prior to procedure a time out was called to verify the correct patient, procedure, equipment, support staff and site/side marked as required. Patient was prepped and draped in the usual sterile fashion.     Plan: She is given a small amount of Norco to take for the next few days as the injection does not cause flank pain.  She is to use it sparingly.  She understands to wait at least 3 months between injections in the left hip.  Follow-up with [place] on as-needed basis.  Questions were encouraged and answered.

## 2018-11-15 ENCOUNTER — Ambulatory Visit (INDEPENDENT_AMBULATORY_CARE_PROVIDER_SITE_OTHER): Payer: 59 | Admitting: Physician Assistant

## 2018-11-15 ENCOUNTER — Telehealth: Payer: Self-pay | Admitting: Physician Assistant

## 2018-11-15 ENCOUNTER — Encounter: Payer: Self-pay | Admitting: Physician Assistant

## 2018-11-15 ENCOUNTER — Other Ambulatory Visit: Payer: Self-pay

## 2018-11-15 ENCOUNTER — Ambulatory Visit: Payer: Self-pay

## 2018-11-15 DIAGNOSIS — M25552 Pain in left hip: Secondary | ICD-10-CM

## 2018-11-15 MED ORDER — DICLOFENAC SODIUM 75 MG PO TBEC: 75.0000 mg | DELAYED_RELEASE_TABLET | Freq: Two times a day (BID) | ORAL | 1 refills | Status: DC
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
## 2018-11-15 NOTE — Telephone Encounter (Signed)
Patient called asked if a note can be emailed to her stating she can not climb stairs at work for a couple of weeks. Email address is [email or web address]. The number to contact [place] is [phone]

## 2018-11-15 NOTE — Telephone Encounter (Signed)
Sent to email provided

## 2019-01-10 ENCOUNTER — Other Ambulatory Visit: Payer: Self-pay | Admitting: Physician Assistant

## 2019-03-06 ENCOUNTER — Other Ambulatory Visit: Payer: Self-pay | Admitting: Orthopaedic Surgery

## 2019-05-04 ENCOUNTER — Other Ambulatory Visit: Payer: Self-pay | Admitting: Orthopaedic Surgery

## 2019-07-03 ENCOUNTER — Other Ambulatory Visit: Payer: Self-pay | Admitting: Orthopaedic Surgery

## 2019-09-01 ENCOUNTER — Other Ambulatory Visit: Payer: Self-pay | Admitting: Orthopaedic Surgery

## 2019-09-03 ENCOUNTER — Other Ambulatory Visit: Payer: Self-pay

## 2019-09-03 ENCOUNTER — Other Ambulatory Visit: Payer: Self-pay | Admitting: Physician Assistant

## 2019-09-03 ENCOUNTER — Ambulatory Visit: Payer: 59 | Admitting: Orthopaedic Surgery

## 2019-09-03 ENCOUNTER — Encounter: Payer: Self-pay | Admitting: Orthopaedic Surgery

## 2019-09-03 ENCOUNTER — Telehealth: Payer: Self-pay | Admitting: Physician Assistant

## 2019-09-03 DIAGNOSIS — M7062 Trochanteric bursitis, left hip: Secondary | ICD-10-CM

## 2019-09-03 MED ORDER — METHYLPREDNISOLONE ACETATE 40 MG/ML IJ SUSP
40.0000 mg | INTRAMUSCULAR | Status: AC | PRN
  Administered 2019-09-03: 40 mg via INTRA_ARTICULAR

## 2019-09-03 MED ORDER — HYDROCODONE-ACETAMINOPHEN 5-325 MG PO TABS: 1.0000 | ORAL_TABLET | Freq: Four times a day (QID) | ORAL | 0 refills | Status: DC | PRN

## 2019-09-03 MED ORDER — LIDOCAINE HCL 1 % IJ SOLN
3.0000 mL | INTRAMUSCULAR | Status: AC | PRN
  Administered 2019-09-03: 3 mL

## 2019-09-03 NOTE — Telephone Encounter (Signed)
Done

## 2019-09-03 NOTE — Telephone Encounter (Signed)
Patient called needing a Rx for Vicodin called into the [facility] pharmacy in [place] on [address]. The number to contact patient is [phone]

## 2019-09-03 NOTE — Progress Notes (Signed)
   Procedure Note  Patient: Gwendolyne Welford             Date of Birth: Jan 08, 1966           MRN: [id number]             Visit Date: 09/03/2019  HPI: Roe Coombs returns today with left hip pain.  States pains been worse over the last 6 weeks.  No known injury.  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  She has no pain on the right side which she underwent open bursectomy.  States she has no pain in the right hip.  She is having no numbness tingling down the left hip.  Pain is lateral aspect of the hip.  Physical exam: Left hip tenderness over the trochanteric region.  No rashes skin rashes over the lateral hip.  Ambulates without any assistive device.  Procedures: Visit Diagnoses:  1. Trochanteric bursitis of left hip     Large Joint Inj: L greater trochanter on 09/03/2019 8:41 AM Indications: pain Details: 22 G 1.5 in needle, lateral approach  Arthrogram: No  Medications: 3 mL lidocaine 1 %; 40 mg methylPREDNISolone acetate 40 MG/ML Outcome: tolerated well, no immediate complications Procedure, treatment alternatives, risks and benefits explained, specific risks discussed. Consent was given by the patient. Immediately prior to procedure a time out was called to verify the correct patient, procedure, equipment, support staff and site/side marked as required. Patient was prepped and draped in the usual sterile fashion.     Plan: She will continue to work on stretching.  Follow-up with [place] as needed.  She knows to wait at least 6 months between injections.  Questions encouraged and answered

## 2019-09-03 NOTE — Telephone Encounter (Signed)
Please advise 

## 2019-10-30 ENCOUNTER — Other Ambulatory Visit: Payer: Self-pay | Admitting: Physician Assistant

## 2019-10-30 ENCOUNTER — Telehealth: Payer: Self-pay | Admitting: Physician Assistant

## 2019-10-30 MED ORDER — NAPROXEN 500 MG PO TABS: 500.0000 mg | ORAL_TABLET | Freq: Two times a day (BID) | ORAL | 1 refills | Status: DC

## 2019-10-30 NOTE — Telephone Encounter (Signed)
Patient called requesting a call back From PA Clark. She has medical questions. Patient phone number is [phone].

## 2019-10-30 NOTE — Telephone Encounter (Signed)
Please get MRI left evaluate cartilage continued hip pain and some groin pain.

## 2019-10-31 ENCOUNTER — Other Ambulatory Visit: Payer: Self-pay

## 2019-10-31 DIAGNOSIS — M7062 Trochanteric bursitis, left hip: Secondary | ICD-10-CM

## 2019-10-31 DIAGNOSIS — M25552 Pain in left hip: Secondary | ICD-10-CM

## 2019-10-31 MED ORDER — NAPROXEN 500 MG PO TABS: 500.0000 mg | ORAL_TABLET | Freq: Two times a day (BID) | ORAL | 1 refills | Status: DC

## 2019-11-18 ENCOUNTER — Other Ambulatory Visit: Payer: Self-pay | Admitting: Orthopaedic Surgery

## 2019-11-20 ENCOUNTER — Ambulatory Visit
Admission: RE | Admit: 2019-11-20 | Discharge: 2019-11-20 | Disposition: A | Discharge status: Home / Self Care | Payer: 59 | Source: Ambulatory Visit | Attending: Orthopedic Surgery | Admitting: Orthopedic Surgery

## 2019-11-20 ENCOUNTER — Other Ambulatory Visit: Payer: Self-pay

## 2019-11-20 DIAGNOSIS — M25552 Pain in left hip: Secondary | ICD-10-CM

## 2019-11-20 DIAGNOSIS — M7062 Trochanteric bursitis, left hip: Secondary | ICD-10-CM

## 2019-11-26 ENCOUNTER — Ambulatory Visit: Payer: 59 | Admitting: Orthopaedic Surgery

## 2019-11-26 ENCOUNTER — Encounter: Payer: Self-pay | Admitting: Orthopaedic Surgery

## 2019-11-26 DIAGNOSIS — M7541 Impingement syndrome of right shoulder: Secondary | ICD-10-CM

## 2019-11-26 DIAGNOSIS — M7062 Trochanteric bursitis, left hip: Secondary | ICD-10-CM

## 2019-11-26 MED ORDER — METHYLPREDNISOLONE ACETATE 40 MG/ML IJ SUSP
40.0000 mg | INTRAMUSCULAR | Status: AC | PRN
  Administered 2019-11-26: 40 mg via INTRA_ARTICULAR

## 2019-11-26 MED ORDER — LIDOCAINE HCL 1 % IJ SOLN
3.0000 mL | INTRAMUSCULAR | Status: AC | PRN
  Administered 2019-11-26: 3 mL

## 2019-11-26 NOTE — Progress Notes (Signed)
Office Visit Note   Patient: Mercedes Stevens           Date of Birth: March 03, 1965           MRN: [id number] Visit Date: 11/26/2019              Requested by: No referring provider defined for this encounter. PCP: Patient, No Pcp Per   Assessment & Plan: Visit Diagnoses:  1. Trochanteric bursitis of left hip   2. Impingement syndrome of right shoulder     Plan: From a shoulder standpoint, I did recommend a steroid injection of the right shoulder subacromial space which she agreed to and tolerated well.  From a left hip standpoint, I recommended a trochanteric bursectomy if he gets worse bothering her enough.  Since she has excess on the right side before I feel like it would likely have success in the left side.  She is that she is noncompliant with her job right now that she can have the surgery in the immediate timeframe.  She does have our surgery scheduler's card and if he gets to the point she wants to be scheduled for left hip trochanteric bursectomy, she will give [place] a call.  Follow-Up Instructions: Return if symptoms worsen or fail to improve.   Orders:  Orders Placed This Encounter  Procedures  . Large Joint Inj   No orders of the defined types were placed in this encounter.     Procedures: Large Joint Inj: R subacromial bursa on 11/26/2019 8:14 AM Indications: pain and diagnostic evaluation Details: 22 G 1.5 in needle  Arthrogram: No  Medications: 3 mL lidocaine 1 %; 40 mg methylPREDNISolone acetate 40 MG/ML Outcome: tolerated well, no immediate complications Procedure, treatment alternatives, risks and benefits explained, specific risks discussed. Consent was given by the patient. Immediately prior to procedure a time out was called to verify the correct patient, procedure, equipment, support staff and site/side marked as required. Patient was prepped and draped in the usual sterile fashion.       Clinical Data: No additional findings.   Subjective: Chief  Complaint  Patient presents with  . Left Hip - Follow-up  The patient was coming in today just to go over MRI of her left hip from chronic trochanteric bursitis.  She also wants me to evaluate her right shoulder today.  She works with the [employer].  She does a lot of repetitive overhead activities and reaching above her.  She is a very short individual and has a reach up and do repetitive work all day long with her right shoulder.  She denies any injuries but it hurts with overhead activities and reaching behind her as well.  She denies any neck pain or numbness and tingling going down her hand.  She is also here for follow-up of MRI of her left hip due to chronic left hip trochanteric bursitis.  She has a remote history of a right hip trochanteric bursectomy in 2018 and states that she is asymptomatic on the right side and she has done very well with that.  HPI  Review of Systems She currently denies any headache, chest pain, shortness of breath, fever, chills, nausea, vomiting  Objective: Vital Signs: There were no vitals taken for this visit.  Physical Exam She is alert and oriented x3 and in no acute distress Ortho Exam Examination of her right shoulder does show signs of impingement.  The range of motion is full and her rotator cuff is strong.  Most of her pain is with abduction and forward flexion as well as external rotation and all this is more so when she reaches overhead.  Examination of her left hip shows pain only to palpation over the trochanteric area.  The remainder of her left hip exam is normal. Specialty Comments:  No specialty comments available.  Imaging: No results found. The MRI of her left hip does show signs consistent with trochanteric bursitis and tendinitis of the gluteus minimus tendon.  There is no tears and only minimal arthritis in the left hip itself.  PMFS History: Patient Active Problem List   Diagnosis Date Noted  . Trochanteric bursitis, right hip  09/05/2016  . Hip pain 03/10/2016  . Bursitis of left hip 03/10/2016  . Bursitis of right hip 03/10/2016   History reviewed. No pertinent past medical history.  Family History  Problem Relation Age of Onset  . Heart attack Mother   . Hypertension Mother   . Hyperlipidemia Mother   . Kidney disease Maternal Aunt     History reviewed. No pertinent surgical history. Social History   Occupational History  . Not on file  Tobacco Use  . Smoking status: Current Every Day Smoker    Packs/day: 2.00    Years: 28.00    Pack years: 56.00    Types: Cigarettes  . Smokeless tobacco: Never Used  Substance and Sexual Activity  . Alcohol use: Not Currently  . Drug use: Never  . Sexual activity: Not on file

## 2019-12-22 ENCOUNTER — Other Ambulatory Visit: Payer: Self-pay | Admitting: Physician Assistant

## 2020-02-20 ENCOUNTER — Other Ambulatory Visit: Payer: Self-pay | Admitting: Physician Assistant

## 2020-03-03 ENCOUNTER — Ambulatory Visit (INDEPENDENT_AMBULATORY_CARE_PROVIDER_SITE_OTHER): Payer: BLUE CROSS/BLUE SHIELD | Admitting: Orthopaedic Surgery

## 2020-03-03 ENCOUNTER — Encounter: Payer: Self-pay | Admitting: Orthopaedic Surgery

## 2020-03-03 DIAGNOSIS — M7062 Trochanteric bursitis, left hip: Secondary | ICD-10-CM

## 2020-03-03 MED ORDER — METHYLPREDNISOLONE ACETATE 40 MG/ML IJ SUSP
40.0000 mg | INTRAMUSCULAR | Status: AC | PRN
  Administered 2020-03-03: 40 mg via INTRA_ARTICULAR

## 2020-03-03 MED ORDER — LIDOCAINE HCL 1 % IJ SOLN
3.0000 mL | INTRAMUSCULAR | Status: AC | PRN
  Administered 2020-03-03: 3 mL

## 2020-03-03 NOTE — Progress Notes (Signed)
Office Visit Note   Patient: Mckaila Duffus           Date of Birth: 10-08-1965           MRN: [id number] Visit Date: 03/03/2020              Requested by: No referring provider defined for this encounter. PCP: Patient, No Pcp Per   Assessment & Plan: Visit Diagnoses:  1. Trochanteric bursitis of left hip     Plan: Per the patient's request I did provide a steroid injection around her left hip trochanteric area which she tolerated well.  We can always repeat this injection in the future.  She said that she would see [place] in 6 months.  Follow-Up Instructions: Return in about 6 months (around 08/31/2020).   Orders:  Orders Placed This Encounter  Procedures  . Large Joint Inj   No orders of the defined types were placed in this encounter.     Procedures: Large Joint Inj: L greater trochanter on 03/03/2020 8:12 AM Indications: pain and diagnostic evaluation Details: 22 G 1.5 in needle, lateral approach  Arthrogram: No  Medications: 3 mL lidocaine 1 %; 40 mg methylPREDNISolone acetate 40 MG/ML Outcome: tolerated well, no immediate complications Procedure, treatment alternatives, risks and benefits explained, specific risks discussed. Consent was given by the patient. Immediately prior to procedure a time out was called to verify the correct patient, procedure, equipment, support staff and site/side marked as required. Patient was prepped and draped in the usual sterile fashion.       Clinical Data: No additional findings.   Subjective: Chief Complaint  Patient presents with  . Left Hip - Pain  . Right Shoulder - Pain  The patient has chronic left hip bursitis.  She has she did very well with a bursectomy on her right side.  She says she is not a position to have that done for left side yet.  Her last left hip injection was 6 months ago.  She has had no other acute change in her medical status.  We injected her right shoulder in November so that is done much better overall  and has no issues with the shoulder.  HPI  Review of Systems There is currently listed headache, chest pain, shortness of breath, fever, chills, nausea, vomiting  Objective: Vital Signs: There were no vitals taken for this visit.  Physical Exam She is alert and oriented x3 and in no acute distress Ortho Exam Examination of her left hip shows a move smoothly and fluidly with pain only over the trochanteric area and the proximal IT band. Specialty Comments:  No specialty comments available.  Imaging: No results found.   PMFS History: Patient Active Problem List   Diagnosis Date Noted  . Trochanteric bursitis, right hip 09/05/2016  . Hip pain 03/10/2016  . Bursitis of left hip 03/10/2016  . Bursitis of right hip 03/10/2016   History reviewed. No pertinent past medical history.  Family History  Problem Relation Age of Onset  . Heart attack Mother   . Hypertension Mother   . Hyperlipidemia Mother   . Kidney disease Maternal Aunt     History reviewed. No pertinent surgical history. Social History   Occupational History  . Not on file  Tobacco Use  . Smoking status: Current Every Day Smoker    Packs/day: 2.00    Years: 28.00    Pack years: 56.00    Types: Cigarettes  . Smokeless tobacco: Never Used  Substance  and Sexual Activity  . Alcohol use: Not Currently  . Drug use: Never  . Sexual activity: Not on file

## 2020-04-30 ENCOUNTER — Other Ambulatory Visit: Payer: Self-pay | Admitting: Orthopaedic Surgery

## 2020-05-11 ENCOUNTER — Other Ambulatory Visit: Payer: Self-pay | Admitting: Student

## 2020-05-11 DIAGNOSIS — R0989 Other specified symptoms and signs involving the circulatory and respiratory systems: Secondary | ICD-10-CM

## 2020-07-08 ENCOUNTER — Ambulatory Visit
Admission: RE | Admit: 2020-07-08 | Discharge: 2020-07-08 | Disposition: A | Discharge status: Home / Self Care | Payer: BLUE CROSS/BLUE SHIELD | Source: Ambulatory Visit | Attending: Student | Admitting: Student

## 2020-07-08 DIAGNOSIS — R0989 Other specified symptoms and signs involving the circulatory and respiratory systems: Secondary | ICD-10-CM

## 2020-09-01 ENCOUNTER — Ambulatory Visit: Payer: BLUE CROSS/BLUE SHIELD | Admitting: Orthopaedic Surgery

## 2020-09-01 ENCOUNTER — Encounter: Payer: Self-pay | Admitting: Orthopaedic Surgery

## 2020-09-01 ENCOUNTER — Other Ambulatory Visit: Payer: Self-pay

## 2020-09-01 DIAGNOSIS — M7062 Trochanteric bursitis, left hip: Secondary | ICD-10-CM

## 2020-09-01 MED ORDER — LIDOCAINE HCL 1 % IJ SOLN
3.0000 mL | INTRAMUSCULAR | Status: AC | PRN
  Administered 2020-09-01: 3 mL

## 2020-09-01 MED ORDER — METHYLPREDNISOLONE ACETATE 40 MG/ML IJ SUSP
40.0000 mg | INTRAMUSCULAR | Status: AC | PRN
  Administered 2020-09-01: 40 mg via INTRA_ARTICULAR

## 2020-09-01 NOTE — Progress Notes (Signed)
Office Visit Note   Patient: Mercedes Stevens           Date of Birth: 18-Feb-1965           MRN: [id number] Visit Date: 09/01/2020              Requested by: No referring provider defined for this encounter. PCP: Patient, No Pcp Per (Inactive)   Assessment & Plan: Visit Diagnoses:  1. Trochanteric bursitis, left hip     Plan: Per the patient's request I did provide a steroid injection over left hip trochanteric area.  At this point I agree with scheduling a left hip trochanteric bursectomy when she gets to the point where she would like [place] to perform that.  She will let us know.  All questions and concerns were answered and addressed.  Follow-Up Instructions: Return if symptoms worsen or fail to improve.   Orders:  Orders Placed This Encounter  Procedures   Large Joint Inj   No orders of the defined types were placed in this encounter.     Procedures: Large Joint Inj: L greater trochanter on 09/01/2020 8:17 AM Indications: pain and diagnostic evaluation Details: 22 G 1.5 in needle, lateral approach  Arthrogram: No  Medications: 3 mL lidocaine 1 %; 40 mg methylPREDNISolone acetate 40 MG/ML Outcome: tolerated well, no immediate complications Procedure, treatment alternatives, risks and benefits explained, specific risks discussed. Consent was given by the patient. Immediately prior to procedure a time out was called to verify the correct patient, procedure, equipment, support staff and site/side marked as required. Patient was prepped and draped in the usual sterile fashion.      Clinical Data: No additional findings.   Subjective: Chief Complaint  Patient presents with   Left Hip - Follow-up  The patient comes in today requesting a steroid injection over her left hip trochanteric area.  She has had multiple injections over a decade now and at left hip always spacing them a far time apart.  She is having pain consistently over the trochanteric area.  In 2018 we performed  a bursectomy over the right side and she has been asymptomatic since then.  She is interested at some point of having a left hip trochanteric bursectomy.  She is had no acute change in her medical status.  HPI  Review of Systems There is currently listed no headache, chest pain, shortness of breath, fever, chills, nausea, vomiting  Objective: Vital Signs: There were no vitals taken for this visit.  Physical Exam She is alert and orient x3 and in no acute distress Ortho Exam Examination of her left hip shows full and fluid range of motion.  There is pain to palpation over the trochanteric area and the IT band proximally consistent with trochanteric bursitis. Specialty Comments:  No specialty comments available.  Imaging: No results found.   PMFS History: Patient Active Problem List   Diagnosis Date Noted   Trochanteric bursitis, right hip 09/05/2016   Hip pain 03/10/2016   Bursitis of left hip 03/10/2016   Bursitis of right hip 03/10/2016   History reviewed. No pertinent past medical history.  Family History  Problem Relation Age of Onset   Heart attack Mother    Hypertension Mother    Hyperlipidemia Mother    Kidney disease Maternal Aunt     History reviewed. No pertinent surgical history. Social History   Occupational History   Not on file  Tobacco Use   Smoking status: Every Day    Packs/day:  2.00    Years: 28.00    Pack years: 56.00    Types: Cigarettes   Smokeless tobacco: Never  Substance and Sexual Activity   Alcohol use: Not Currently   Drug use: Never   Sexual activity: Not on file

## 2020-10-27 ENCOUNTER — Encounter: Payer: Self-pay | Admitting: Orthopaedic Surgery

## 2020-10-27 ENCOUNTER — Other Ambulatory Visit: Payer: Self-pay | Admitting: Orthopaedic Surgery

## 2020-10-27 MED ORDER — METHOCARBAMOL 500 MG PO TABS: 500.0000 mg | ORAL_TABLET | Freq: Four times a day (QID) | ORAL | 1 refills | Status: DC | PRN

## 2021-03-15 ENCOUNTER — Ambulatory Visit: Payer: Self-pay

## 2021-03-15 ENCOUNTER — Other Ambulatory Visit: Payer: Self-pay

## 2021-03-15 ENCOUNTER — Ambulatory Visit (INDEPENDENT_AMBULATORY_CARE_PROVIDER_SITE_OTHER): Payer: Federal, State, Local not specified - PPO | Admitting: Physician Assistant

## 2021-03-15 ENCOUNTER — Encounter: Payer: Self-pay | Admitting: Physician Assistant

## 2021-03-15 DIAGNOSIS — M542 Cervicalgia: Secondary | ICD-10-CM

## 2021-03-15 DIAGNOSIS — M25511 Pain in right shoulder: Secondary | ICD-10-CM | POA: Diagnosis not present

## 2021-03-15 MED ORDER — LIDOCAINE HCL 1 % IJ SOLN
3.0000 mL | INTRAMUSCULAR | Status: AC | PRN
  Administered 2021-03-15: 3 mL

## 2021-03-15 MED ORDER — METHYLPREDNISOLONE ACETATE 40 MG/ML IJ SUSP
40.0000 mg | INTRAMUSCULAR | Status: AC | PRN
  Administered 2021-03-15: 40 mg via INTRA_ARTICULAR

## 2021-03-15 NOTE — Progress Notes (Signed)
Office Visit Note   Patient: Mercedes Stevens           Date of Birth: 1965/10/05           MRN: [id number] Visit Date: 03/15/2021              Requested by: No referring provider defined for this encounter. PCP: Patient, No Pcp Per (Inactive)   Assessment & Plan: Visit Diagnoses:  1. Acute pain of right shoulder   2. Neck pain     Plan:  She shown pendulum, Codman forward flexion and wall crawls which she will do every other day.  We will see him back in 2 weeks see how she is doing overall.  Questions encouraged and answered.  Postinjection her shoulder pain was much improved.   Follow-Up Instructions: Return in about 2 weeks (around 03/29/2021).   Orders:  Orders Placed This Encounter  Procedures   Large Joint Inj   XR Cervical Spine 2 or 3 views   XR Shoulder Right   No orders of the defined types were placed in this encounter.     Procedures: Large Joint Inj: R subacromial bursa on 03/15/2021 3:43 PM Indications: pain Details: 22 G 1.5 in needle, lateral approach  Arthrogram: No  Medications: 3 mL lidocaine 1 %; 40 mg methylPREDNISolone acetate 40 MG/ML Outcome: tolerated well, no immediate complications Procedure, treatment alternatives, risks and benefits explained, specific risks discussed. Consent was given by the patient. Immediately prior to procedure a time out was called to verify the correct patient, procedure, equipment, support staff and site/side marked as required. Patient was prepped and draped in the usual sterile fashion.      Clinical Data: No additional findings.   Subjective: Chief Complaint  Patient presents with   Right Shoulder - Pain   Neck - Pain    HPI Mercedes Stevens returns today due to right shoulder pain.  She has had pain for couple months.  No injury.  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  States the pain is waking her up at night.  She has some numbness tingling in the tips of her fingers but no real radicular symptoms down the arm stiff neck  at times.  She is tried Voltaren gel and over-the-counter NSAIDs without any real relief.  Patient is nondiabetic. Review of Systems Negative for fevers chills.  Objective: Vital Signs: There were no vitals taken for this visit.  Physical Exam Constitutional:      Appearance: She is not ill-appearing or diaphoretic.  Cardiovascular:     Pulses: Normal pulses.  Pulmonary:     Effort: Pulmonary effort is normal.  Neurological:     Mental Status: She is alert and oriented to person, place, and time.  Psychiatric:        Mood and Affect: Mood normal.    Ortho Exam Cervical spine excellent range of motion cervical spine without pain.  Tenderness in the right trapezius region and also in the right superior border scapula.  Bilateral hands full sensation full motor. Bilateral shoulders 5 5 strength external and internal rotation against resistance empty can test is negative bilaterally.  Impingement testing is mildly positive on the right negative on the left.  Liftoff test on the left was negative.  Positive on the right.  Specialty Comments:  No specialty comments available.  Imaging: XR Cervical Spine 2 or 3 views  Result Date: 03/15/2021 Cervical spine 2 views: No acute findings.  Normal lordotic curvature.  Mid cervical spine degenerative  changes with anterior vertebral endplate spurring.  Mild degenerative disc space narrowing C4-C5.  XR Shoulder Right  Result Date: 03/15/2021 Right shoulder 3 views: Shoulder is well located.  Glenohumeral joints well-maintained.  No acute fractures.  AC joint without significant arthropathy.  No acute findings.    PMFS History: Patient Active Problem List   Diagnosis Date Noted   Trochanteric bursitis, right hip 09/05/2016   Hip pain 03/10/2016   Bursitis of left hip 03/10/2016   Bursitis of right hip 03/10/2016   History reviewed. No pertinent past medical history.  Family History  Problem Relation Age of Onset   Heart attack Mother     Hypertension Mother    Hyperlipidemia Mother    Kidney disease Maternal Aunt     History reviewed. No pertinent surgical history. Social History   Occupational History   Not on file  Tobacco Use   Smoking status: Every Day    Packs/day: 2.00    Years: 28.00    Pack years: 56.00    Types: Cigarettes   Smokeless tobacco: Never  Substance and Sexual Activity   Alcohol use: Not Currently   Drug use: Never   Sexual activity: Not on file

## 2021-03-26 ENCOUNTER — Other Ambulatory Visit: Payer: Self-pay | Admitting: Orthopaedic Surgery

## 2021-03-26 ENCOUNTER — Encounter: Payer: Self-pay | Admitting: Orthopaedic Surgery

## 2021-03-26 MED ORDER — METHOCARBAMOL 500 MG PO TABS: 500.0000 mg | ORAL_TABLET | Freq: Four times a day (QID) | ORAL | 1 refills | Status: AC | PRN

## 2021-03-31 ENCOUNTER — Ambulatory Visit: Payer: Federal, State, Local not specified - PPO | Admitting: Physician Assistant

## 2021-04-09 DIAGNOSIS — K529 Noninfective gastroenteritis and colitis, unspecified: Secondary | ICD-10-CM | POA: Diagnosis not present

## 2021-10-05 DIAGNOSIS — Z0000 Encounter for general adult medical examination without abnormal findings: Secondary | ICD-10-CM | POA: Diagnosis not present

## 2021-10-05 DIAGNOSIS — R0789 Other chest pain: Secondary | ICD-10-CM | POA: Diagnosis not present

## 2021-10-05 DIAGNOSIS — L304 Erythema intertrigo: Secondary | ICD-10-CM | POA: Diagnosis not present

## 2021-10-05 DIAGNOSIS — Z23 Encounter for immunization: Secondary | ICD-10-CM | POA: Diagnosis not present

## 2021-10-05 DIAGNOSIS — E559 Vitamin D deficiency, unspecified: Secondary | ICD-10-CM | POA: Diagnosis not present

## 2021-11-04 DIAGNOSIS — H538 Other visual disturbances: Secondary | ICD-10-CM | POA: Diagnosis not present

## 2021-11-04 DIAGNOSIS — Z0100 Encounter for examination of eyes and vision without abnormal findings: Secondary | ICD-10-CM | POA: Diagnosis not present

## 2021-11-08 ENCOUNTER — Telehealth: Payer: Self-pay | Admitting: Orthopaedic Surgery

## 2021-11-08 NOTE — Telephone Encounter (Signed)
Patient would like to come in for a cortisone injection in the left hip on the 25th if possible she is also open to seeing another PA if possible please advise

## 2021-11-17 ENCOUNTER — Ambulatory Visit: Payer: Federal, State, Local not specified - PPO | Admitting: Orthopaedic Surgery

## 2021-11-17 DIAGNOSIS — M7062 Trochanteric bursitis, left hip: Secondary | ICD-10-CM

## 2021-11-17 MED ORDER — METHYLPREDNISOLONE ACETATE 40 MG/ML IJ SUSP
40.0000 mg | INTRAMUSCULAR | Status: AC | PRN
  Administered 2021-11-17: 40 mg via INTRA_ARTICULAR

## 2021-11-17 MED ORDER — LIDOCAINE HCL 1 % IJ SOLN
3.0000 mL | INTRAMUSCULAR | Status: AC | PRN
  Administered 2021-11-17: 3 mL

## 2021-11-17 NOTE — Progress Notes (Signed)
The patient is well-known to me.  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  We have performed a right hip trochanteric bursectomy in the past and that is helped her greatly.  She has chronic bursitis and tendinitis of her left hip and is requesting a steroid injection today.  She was to likely consider surgery at some point but right now she would rather just have an injection.  She is not a diabetic.  She is worked on stretching exercises.  She takes anti-inflammatories when she can.  There is been no acute change in her medical status.  Her left hip moves smoothly and fluidly.  She does not walk with a limp.  She has significant pain to palpation all along the proximal femur on the lateral aspect.  I did provide a steroid injection over the left hip trochanteric.  Which she tolerated well.  Follow-up is as needed.  If she does get to the point where injections not helping and she would like to consider surgery she will let us know.     Procedure Note  Patient: Mercedes Stevens             Date of Birth: 08-02-1965           MRN: [id number]             Visit Date: 11/17/2021  Procedures: Visit Diagnoses:  1. Trochanteric bursitis, left hip     Large Joint Inj: L greater trochanter on 11/17/2021 1:04 PM Indications: pain and diagnostic evaluation Details: 22 G 1.5 in needle, lateral approach  Arthrogram: No  Medications: 3 mL lidocaine 1 %; 40 mg methylPREDNISolone acetate 40 MG/ML Outcome: tolerated well, no immediate complications Procedure, treatment alternatives, risks and benefits explained, specific risks discussed. Consent was given by the patient. Immediately prior to procedure a time out was called to verify the correct patient, procedure, equipment, support staff and site/side marked as required. Patient was prepped and draped in the usual sterile fashion.

## 2021-11-29 DIAGNOSIS — Z1389 Encounter for screening for other disorder: Secondary | ICD-10-CM | POA: Diagnosis not present

## 2021-11-29 DIAGNOSIS — F419 Anxiety disorder, unspecified: Secondary | ICD-10-CM | POA: Diagnosis not present

## 2022-03-20 IMAGING — US US CAROTID DUPLEX BILAT
1 series · 13 of 24 positions shown · non-contrast
Comparison: None.

CLINICAL DATA: Left carotid bruit.  History of tobacco abuse.

EXAM:
BILATERAL CAROTID DUPLEX ULTRASOUND
TECHNIQUE: Gray scale imaging, color Doppler and duplex ultrasound were
performed of bilateral carotid and vertebral arteries in the neck.

[Series 1: us carotid duplex bilat · 0.06mm/px · 13 of 76 slices shown]
[im 1/76]
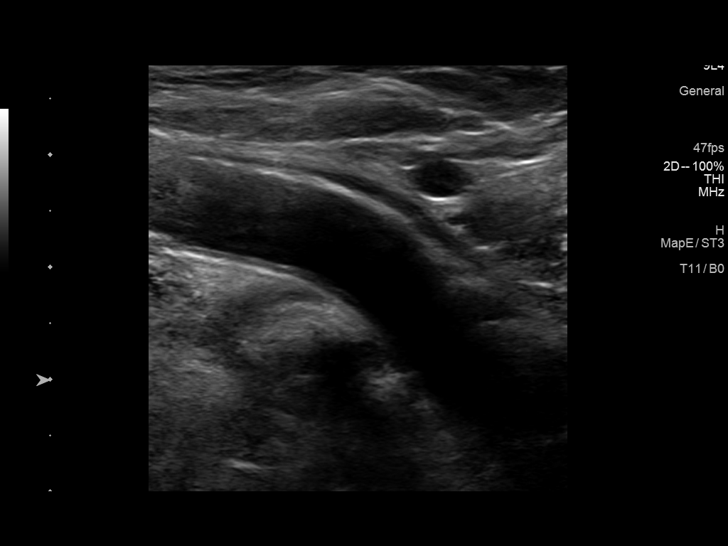
[im 7/76]
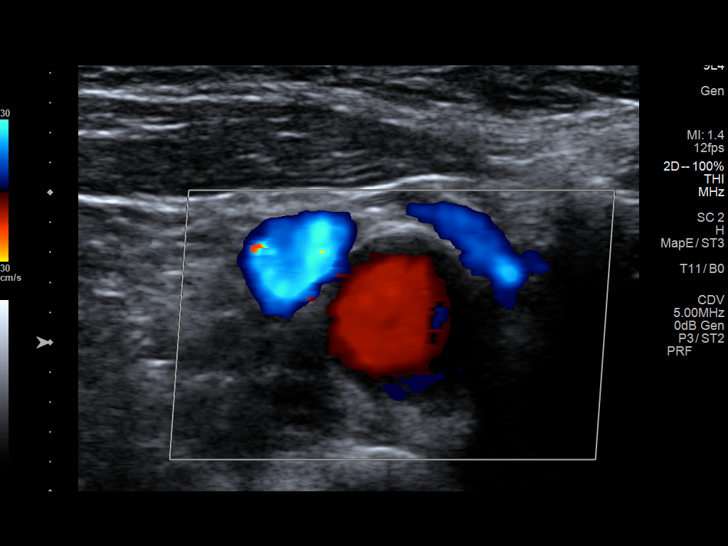
[im 14/76]
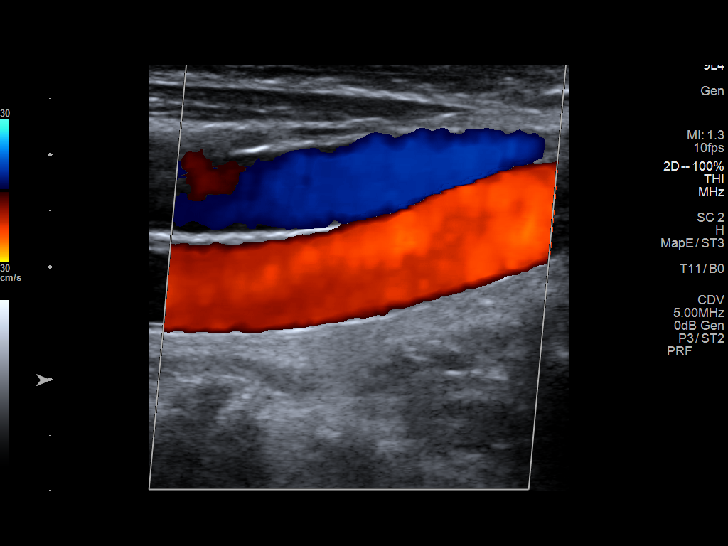
[im 20/76]
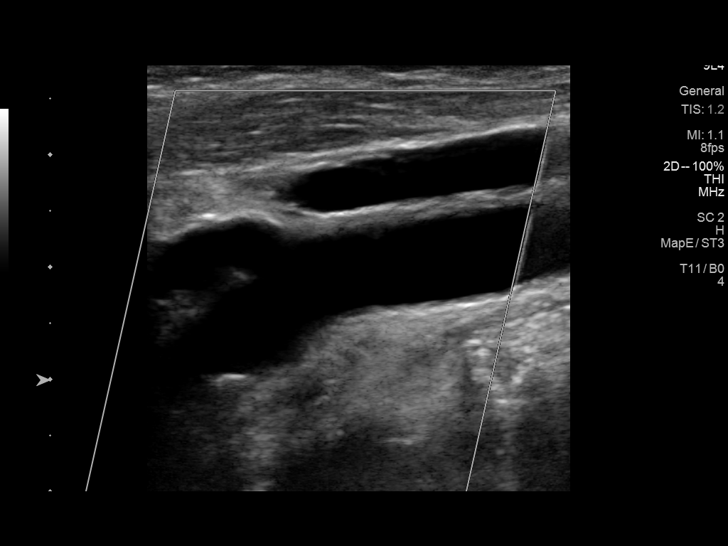
[im 27/76]
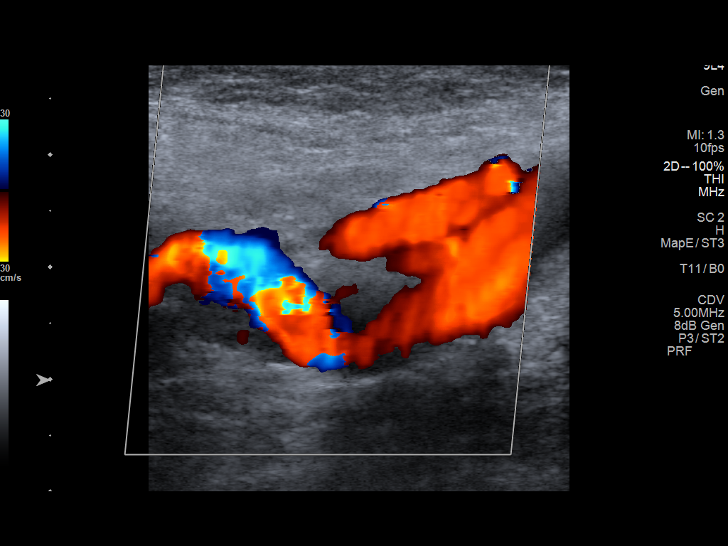
[im 33/76]
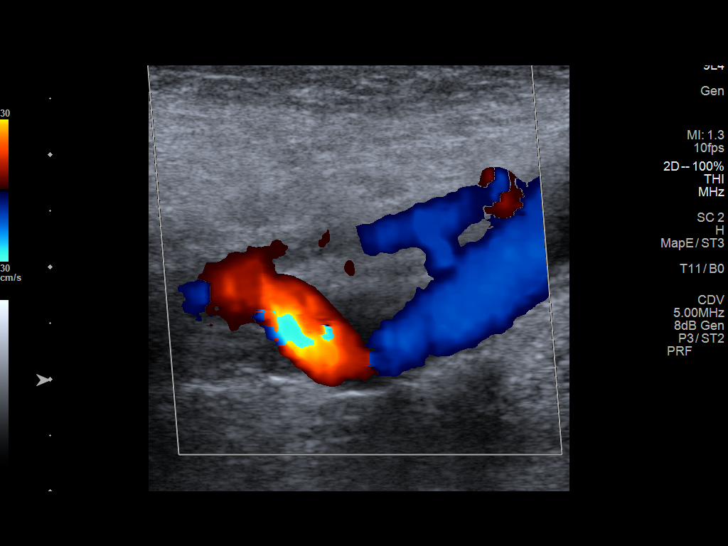
[im 40/76]
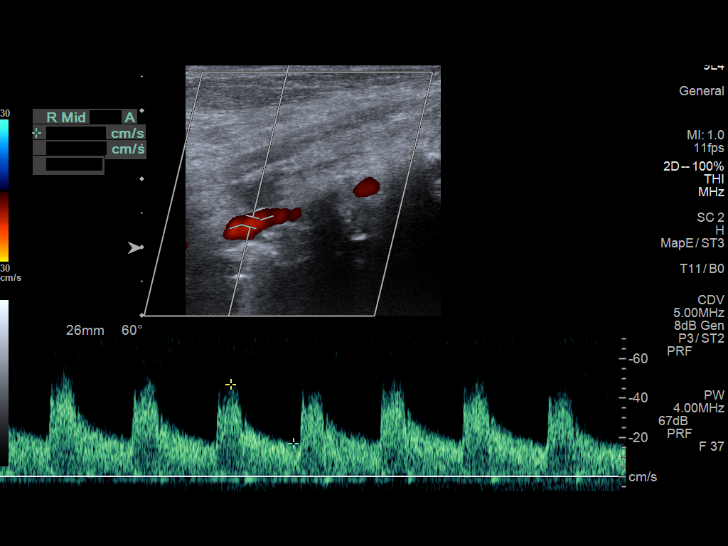
[im 43/76]
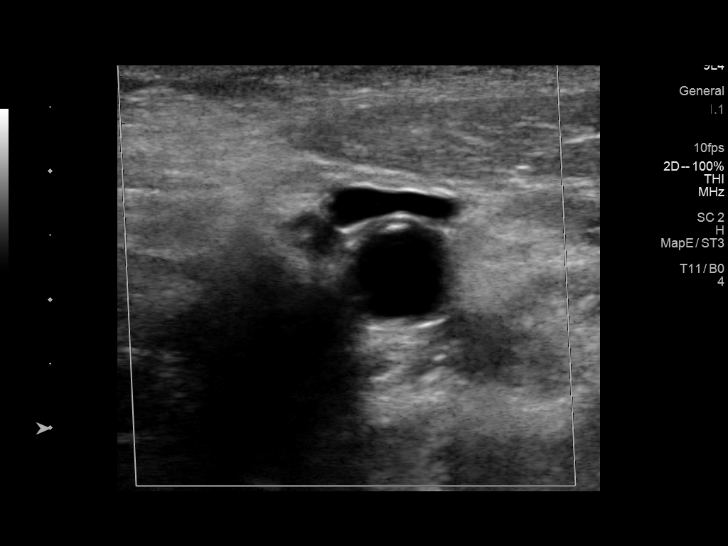
[im 49/76]
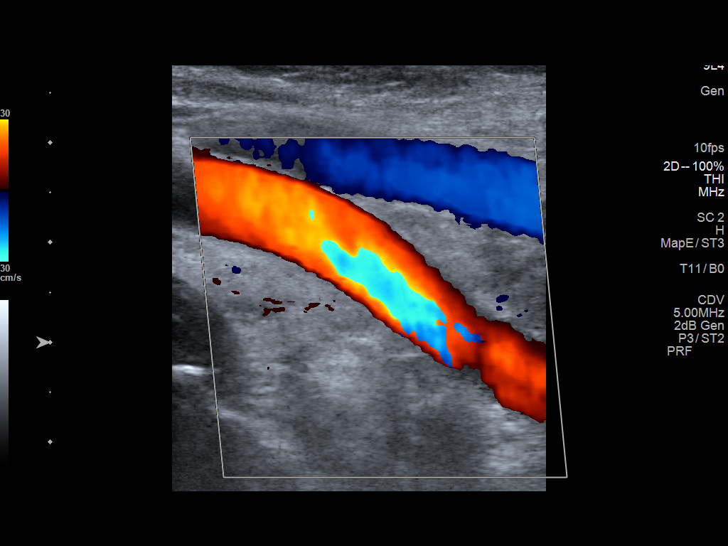
[im 56/76]
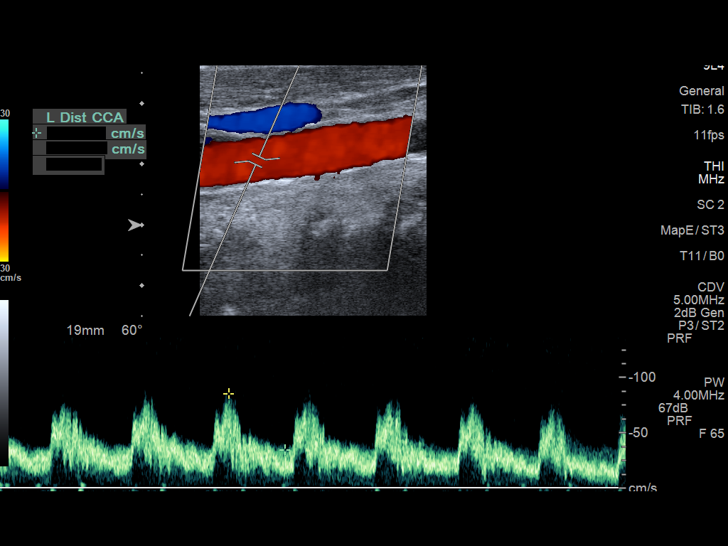
[im 62/76]
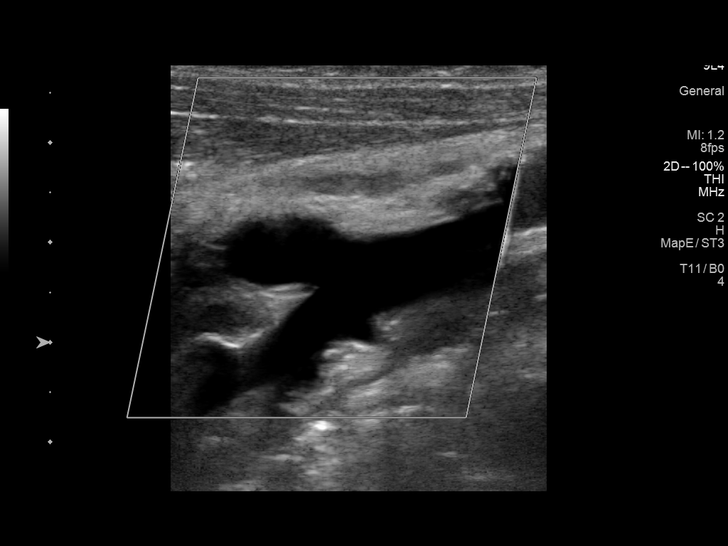
[im 69/76]
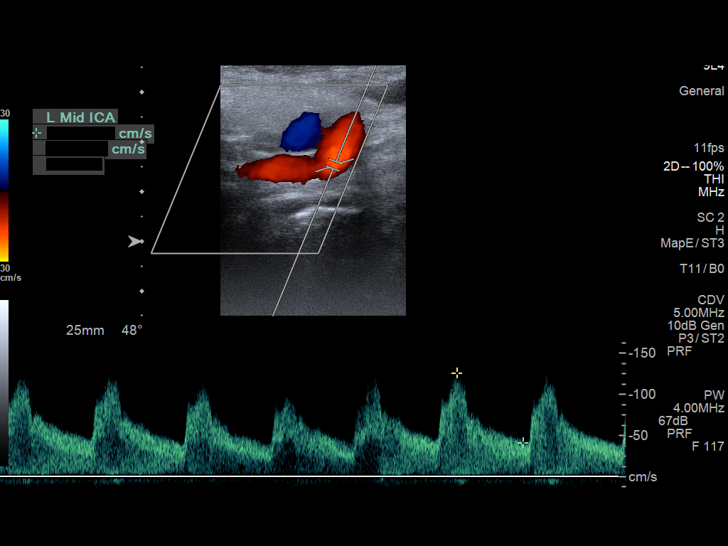
[im 76/76]
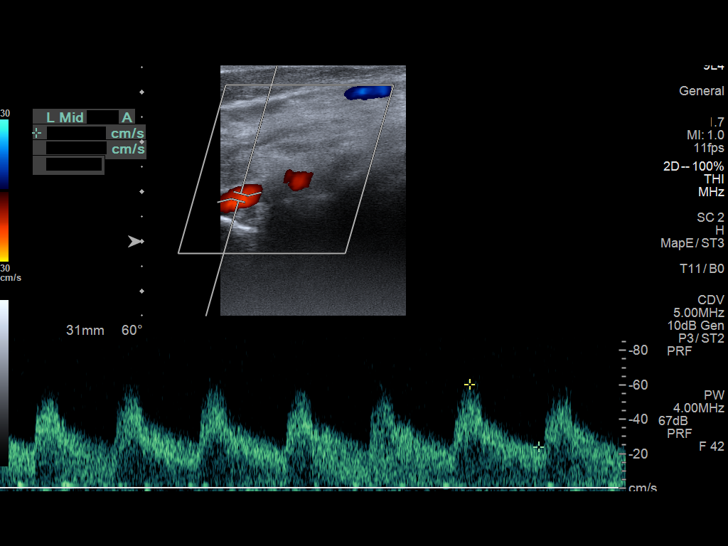

[13 of 24 positions shown; findings below may reference images not displayed]

FINDINGS: Criteria: Quantification of carotid stenosis is based on velocity
parameters that correlate the residual internal carotid diameter
with NASCET-based stenosis levels, using the diameter of the distal
internal carotid lumen as the denominator for stenosis measurement.

The following velocity measurements were obtained:

RIGHT

ICA: 198/69 cm/sec

CCA: 102/27 cm/sec

SYSTOLIC ICA/CCA RATIO:

ECA: 101 cm/sec

LEFT

ICA: 147/50 cm/sec

CCA: 95/30 cm/sec

SYSTOLIC ICA/CCA RATIO:

ECA: 107 cm/sec

RIGHT CAROTID ARTERY: Intimal thickening through the common carotid
and bulb. Mildly tortuous ICA with plaque in its midportion, some
mildly increased peak systolic velocities immediately distal to the
plaque. Normal color flow signal and waveforms elsewhere.

RIGHT VERTEBRAL ARTERY:  Normal flow direction and waveform.

LEFT CAROTID ARTERY: Mild plaque at the left carotid bifurcation
without high-grade stenosis. ICA is tortuous. Normal waveforms and
color Doppler signal.

LEFT VERTEBRAL ARTERY:  Normal flow direction and waveform.

Upper extremity blood pressures: RIGHT: 145/86 LEFT: 158/69
IMPRESSION: 1. Plaque in the mid right ICA resulting in 50-69% diameter
stenosis.
2. Left carotid bifurcation plaque resulting in less than 50%
diameter stenosis.
3.  Antegrade bilateral vertebral arterial flow.

## 2022-03-31 ENCOUNTER — Encounter: Payer: Self-pay | Admitting: Radiology

## 2022-04-29 DIAGNOSIS — Z20822 Contact with and (suspected) exposure to covid-19: Secondary | ICD-10-CM | POA: Diagnosis not present

## 2022-04-29 DIAGNOSIS — J069 Acute upper respiratory infection, unspecified: Secondary | ICD-10-CM | POA: Diagnosis not present

## 2022-04-29 DIAGNOSIS — R0602 Shortness of breath: Secondary | ICD-10-CM | POA: Diagnosis not present

## 2022-04-29 DIAGNOSIS — R030 Elevated blood-pressure reading, without diagnosis of hypertension: Secondary | ICD-10-CM | POA: Diagnosis not present

## 2022-05-09 ENCOUNTER — Other Ambulatory Visit: Payer: Self-pay

## 2022-05-09 ENCOUNTER — Encounter: Payer: Self-pay | Admitting: *Deleted

## 2022-05-09 ENCOUNTER — Emergency Department: Payer: Federal, State, Local not specified - PPO

## 2022-05-09 ENCOUNTER — Emergency Department
Admission: EM | Admit: 2022-05-09 | Discharge: 2022-05-09 | Disposition: A | Discharge status: Home / Self Care | Payer: Federal, State, Local not specified - PPO | Attending: Emergency Medicine | Admitting: Emergency Medicine

## 2022-05-09 DIAGNOSIS — R059 Cough, unspecified: Secondary | ICD-10-CM | POA: Diagnosis not present

## 2022-05-09 DIAGNOSIS — J189 Pneumonia, unspecified organism: Secondary | ICD-10-CM | POA: Diagnosis not present

## 2022-05-09 DIAGNOSIS — R0789 Other chest pain: Secondary | ICD-10-CM | POA: Diagnosis not present

## 2022-05-09 DIAGNOSIS — R079 Chest pain, unspecified: Secondary | ICD-10-CM | POA: Diagnosis not present

## 2022-05-09 DIAGNOSIS — R0602 Shortness of breath: Secondary | ICD-10-CM | POA: Diagnosis not present

## 2022-05-09 LAB — BASIC METABOLIC PANEL
Anion gap: 11 (ref 5–15)
BUN: 12 mg/dL (ref 6–20)
CO2: 24 mmol/L (ref 22–32)
Calcium: 8.7 mg/dL — ABNORMAL LOW (ref 8.9–10.3)
Chloride: 100 mmol/L (ref 98–111)
Creatinine, Ser: 0.84 mg/dL (ref 0.44–1.00)
GFR, Estimated: >60 mL/min (ref >60)
Glucose, Bld: 88 mg/dL (ref 70–99)
Potassium: 3.6 mmol/L (ref 3.5–5.1)
Sodium: 135 mmol/L (ref 135–145)

## 2022-05-09 LAB — CBC
HCT: 40.3 % (ref 36.0–46.0)
Hemoglobin: 13 g/dL (ref 12.0–15.0)
MCH: 29.7 pg (ref 26.0–34.0)
MCHC: 32.3 g/dL (ref 30.0–36.0)
MCV: 92 fL (ref 80.0–100.0)
Platelets: 392 10*3/uL (ref 150–400)
RBC: 4.38 MIL/uL (ref 3.87–5.11)
RDW: 13.2 % (ref 11.5–15.5)
WBC: 14.4 10*3/uL — ABNORMAL HIGH (ref 4.0–10.5)
nRBC: 0 % (ref 0.0–0.2)

## 2022-05-09 LAB — TROPONIN I (HIGH SENSITIVITY)
Troponin I (High Sensitivity): 3 ng/L (ref <18)
Troponin I (High Sensitivity): 3 ng/L (ref <18)

## 2022-05-09 MED ORDER — IOHEXOL 350 MG/ML SOLN
75.0000 mL | Freq: Once | INTRAVENOUS | Status: AC | PRN
  Administered 2022-05-09: 75 mL via INTRAVENOUS

## 2022-05-09 MED ORDER — AZITHROMYCIN 500 MG PO TABS
500.0000 mg | ORAL_TABLET | Freq: Once | ORAL | Status: AC
  Administered 2022-05-09: 500 mg via ORAL
  Filled 2022-05-09: qty 1

## 2022-05-09 MED ORDER — AZITHROMYCIN 250 MG PO TABS: ORAL_TABLET | ORAL | 0 refills | Status: AC

## 2022-05-09 NOTE — Discharge Instructions (Signed)
Take Azithromycin once daily for the next four days.

## 2022-05-09 NOTE — ED Provider Notes (Signed)
[facility] Provider Note  Patient Contact: 7:16 PM (approximate)   History   Chest Pain   HPI  Mercedes Stevens is a 58 y.o. female with a history of daily smoking, presents to the emergency department with cough, midsternal chest pain and upper back pain.  Patient states that she was diagnosed with an upper respiratory tract infection and tested negative for both COVID and flu.  She states that she was treated with both prednisone and Tessalon Perles.  She reports that she thought her symptoms were improving some but then she stopped the steroid and feels as though they are worsening again.  She denies lower extremity swelling or prior history of PE.      Physical Exam   Triage Vital Signs: ED Triage Vitals  Enc Vitals Group     BP 05/09/22 1754 (!) 148/116     Pulse Rate 05/09/22 1754 86     Resp 05/09/22 1754 18     Temp 05/09/22 1754 98.3 F (36.8 C)     Temp Source 05/09/22 1754 Oral     SpO2 05/09/22 1754 98 %     Weight 05/09/22 1747 138 lb (62.6 kg)     Height 05/09/22 1747 4\' 10"  (1.473 m)     Head Circumference --      Peak Flow --      Pain Score 05/09/22 1747 0     Pain Loc --      Pain Edu? --      Excl. in GC? --     Most recent vital signs: Vitals:   05/09/22 1754  BP: (!) 148/116  Pulse: 86  Resp: 18  Temp: 98.3 F (36.8 C)  SpO2: 98%     General: Alert and in no acute distress. Eyes:  PERRL. EOMI. Head: No acute traumatic findings ENT:      Nose: No congestion/rhinnorhea.      Mouth/Throat: Mucous membranes are moist. Neck: No stridor. No cervical spine tenderness to palpation. Cardiovascular:  Good peripheral perfusion Respiratory: Normal respiratory effort without tachypnea or retractions. Lungs CTAB. Good air entry to the bases with no decreased or absent breath sounds. Gastrointestinal: Bowel sounds 4 quadrants. Soft and nontender to palpation. No guarding or rigidity. No palpable masses. No distention. No CVA  tenderness. Musculoskeletal: Full range of motion to all extremities.  Neurologic:  No gross focal neurologic deficits are appreciated.  Skin:   No rash noted    ED Results / Procedures / Treatments   Labs (all labs ordered are listed, but only abnormal results are displayed) Labs Reviewed  BASIC METABOLIC PANEL - Abnormal; Notable for the following components:      Result Value   Calcium 8.7 (*)    All other components within normal limits  CBC - Abnormal; Notable for the following components:   WBC 14.4 (*)    All other components within normal limits  TROPONIN I (HIGH SENSITIVITY)  TROPONIN I (HIGH SENSITIVITY)     EKG  Normal sinus rhythm without ST segment elevation or other apparent arrhythmia.   RADIOLOGY  I personally viewed and evaluated these images as part of my medical decision making, as well as reviewing the written report by the radiologist.  ED Provider Interpretation: Groundglass opacities on CT angio chest concerning for atypical infection.  No evidence of PE.   PROCEDURES:  Critical Care performed: No  Procedures   MEDICATIONS ORDERED IN ED: Medications  iohexol (OMNIPAQUE) 350 MG/ML injection 75  mL (75 mLs Intravenous Contrast Given 05/09/22 1934)  azithromycin (ZITHROMAX) tablet 500 mg (500 mg Oral Given 05/09/22 2058)     IMPRESSION / MDM / ASSESSMENT AND PLAN / ED COURSE  I reviewed the triage vital signs and the nursing notes.                              Assessment and plan: Cough:  Sob:  57 year old female presents to the emergency department with cough, shortness of breath, midsternal chest pain and upper back pain.  Vital signs are reassuring at triage.  On exam, patient was alert, active and nontoxic-appearing.  CBC, BMP and troponin within range.  CT angio chest indicates groundglass opacities without other concerning findings.  Will cover patient for atypical pneumonia with azithromycin.  Return precautions were given to  return with new or worsening symptoms.  FINAL CLINICAL IMPRESSION(S) / ED DIAGNOSES   Final diagnoses:  Atypical pneumonia     Rx / DC Orders   ED Discharge Orders          Ordered    azithromycin (ZITHROMAX Z-PAK) 250 MG tablet        05/09/22 2056             Note:  This document was prepared using Dragon voice recognition software and may include unintentional dictation errors.   Pia Mau [place], Cordelia Poche 05/09/22 2116    Merwyn Katos, MD 05/10/22 [id number]

## 2022-05-09 NOTE — ED Triage Notes (Signed)
Pt ambulatory to triage.  Pt has chest pain in center of chest for 2 days.  Intermittent sob.  Cig smoker.  Pt also reports a cough.  No n/v/  pt alert  speech clear.

## 2022-07-07 ENCOUNTER — Ambulatory Visit: Payer: Federal, State, Local not specified - PPO | Attending: Cardiology | Admitting: Cardiology

## 2022-07-07 ENCOUNTER — Encounter: Payer: Self-pay | Admitting: Cardiology

## 2022-07-07 ENCOUNTER — Other Ambulatory Visit
Admission: RE | Admit: 2022-07-07 | Discharge: 2022-07-07 | Disposition: A | Discharge status: Home / Self Care | Payer: Federal, State, Local not specified - PPO | Source: Ambulatory Visit | Attending: Cardiology | Admitting: Cardiology

## 2022-07-07 VITALS — BP 164/90 | HR 60 | Ht <= 58 in | Wt 138.8 lb

## 2022-07-07 DIAGNOSIS — E7800 Pure hypercholesterolemia, unspecified: Secondary | ICD-10-CM | POA: Diagnosis not present

## 2022-07-07 DIAGNOSIS — F17200 Nicotine dependence, unspecified, uncomplicated: Secondary | ICD-10-CM

## 2022-07-07 DIAGNOSIS — R030 Elevated blood-pressure reading, without diagnosis of hypertension: Secondary | ICD-10-CM | POA: Diagnosis not present

## 2022-07-07 DIAGNOSIS — I6529 Occlusion and stenosis of unspecified carotid artery: Secondary | ICD-10-CM | POA: Diagnosis not present

## 2022-07-07 LAB — LIPID PANEL
Cholesterol: 156 mg/dL (ref 0–200)
HDL: 60 mg/dL (ref >40)
LDL Cholesterol: 77 mg/dL (ref 0–99)
Total CHOL/HDL Ratio: 2.6 RATIO
Triglycerides: 93 mg/dL (ref <150)
VLDL: 19 mg/dL (ref 0–40)

## 2022-07-07 MED ORDER — ASPIRIN 81 MG PO TBEC: 81.0000 mg | DELAYED_RELEASE_TABLET | Freq: Every day | ORAL | 3 refills | Status: AC

## 2022-07-07 NOTE — Patient Instructions (Signed)
Medication Instructions:   Your physician recommends that you continue on your current medications as directed. Please refer to the Current Medication list given to you today.  *If you need a refill on your cardiac medications before your next appointment, please call your pharmacy*   Lab Work:  Your physician recommends you go to the medical mall for labs - LIPID  If you have labs (blood work) drawn today and your tests are completely normal, you will receive your results only by: MyChart Message (if you have MyChart) OR A paper copy in the mail If you have any lab test that is abnormal or we need to change your treatment, we will call you to review the results.   Testing/Procedures:  Your physician has requested that you have a carotid duplex. This test is an ultrasound of the carotid arteries in your neck. It looks at blood flow through these arteries that supply the brain with blood. Allow one hour for this exam. There are no restrictions or special instructions.    Follow-Up: At [facility], you and your health needs are our priority.  As part of our continuing mission to provide you with exceptional heart care, we have created designated Provider Care Teams.  These Care Teams include your primary Cardiologist (physician) and Advanced Practice Providers (APPs -  Physician Assistants and Nurse Practitioners) who all work together to provide you with the care you need, when you need it.  We recommend signing up for the patient portal called "MyChart".  Sign up information is provided on this After Visit Summary.  MyChart is used to connect with patients for Virtual Visits (Telemedicine).  Patients are able to view lab/test results, encounter notes, upcoming appointments, etc.  Non-urgent messages can be sent to your provider as well.   To learn more about what you can do with MyChart, go to [email or web address].    Your next appointment:   2 month(s)  Provider:   You  may see Debbe Odea, MD or one of the following Advanced Practice Providers on your designated Care Team:   Nicolasa Ducking, NP Eula Listen, PA-C Cadence Fransico Michael, PA-C Charlsie Quest, NP

## 2022-07-07 NOTE — Progress Notes (Signed)
Cardiology Office Note:    Date:  07/07/2022   ID:  [name], DOB May 24, 1965, MRN [id number]  PCP:  Patient, No Pcp Per   [facility] Providers Cardiologist:  Debbe Odea, MD     Referring MD: No ref. provider found   Chief Complaint  Patient presents with   New Patient (Initial Visit)    Cardiac evaluation at [facility] cardiology clinic in 9/22 (Care Everywhere).  Patient here for follow up on stenosis of carotid artery.  Denies acute cardiac problems/concerns today.      History of Present Illness:    Mercedes Stevens is a 57 y.o. female with a hx of hyperlipidemia, mild to moderate right carotid artery stenosis, mild left carotid stenosis, current smoker x 40+ years who presents due to PAD.  Previously seen at [facility] from a cardiac perspective.  Evaluated in 2022 with symptoms of chest pain.  Echocardiogram and myocardial perfusion imaging were normal.  Currently denies chest pain or shortness of breath.  Endorses eating high salt diet.  Denies edema.  Would like to get ultrasound due to history of carotid stenosis.  Past Medical History:  Diagnosis Date   Bilateral carotid artery stenosis    OCD (obsessive compulsive disorder)     Past Surgical History:  Procedure Laterality Date   APPENDECTOMY     PARTIAL HYSTERECTOMY     right hip bursectomy     TONSILECTOMY, ADENOIDECTOMY, BILATERAL MYRINGOTOMY AND TUBES      Current Medications: Current Meds  Medication Sig   aspirin EC 81 MG tablet Take 1 tablet (81 mg total) by mouth daily. Swallow whole.   atorvastatin (LIPITOR) 40 MG tablet Take 1 tablet by mouth daily.   estradiol (ESTRACE) 1 MG tablet 1 TAB(S) ONCE A DAY FOR 3 WEEKS, THEN HOLD FOR 1 WEEK ORALLY 30 DAY(S)   FLUoxetine (PROZAC) 20 MG tablet Take 20 mg by mouth daily.   HYDROcodone-acetaminophen (NORCO/VICODIN) 5-325 MG tablet Take 1 tablet by mouth every 12 (twelve) hours as needed.   lansoprazole (PREVACID) 30 MG capsule Take 30 mg by mouth daily.    LORazepam (ATIVAN) 0.5 MG tablet Take 0.5 mg by mouth.   methocarbamol (ROBAXIN) 500 MG tablet Take 1 tablet (500 mg total) by mouth every 6 (six) hours as needed.   naproxen (NAPROSYN) 500 MG tablet TAKE 1 TABLET BY MOUTH 2 TIMES DAILY WITH A MEAL.   nitroGLYCERIN (NITROSTAT) 0.4 MG SL tablet Place 0.4 mg under the tongue every 5 (five) minutes as needed.   oxybutynin (DITROPAN-XL) 10 MG 24 hr tablet Take 20 mg by mouth daily.     Allergies:   Oxycodone-acetaminophen   Social History   Socioeconomic History   Marital status: Married    Spouse name: Not on file   Number of children: Not on file   Years of education: Not on file   Highest education level: Not on file  Occupational History   Not on file  Tobacco Use   Smoking status: Every Day    Packs/day: 1.00    Years: 28.00    Additional pack years: 0.00    Total pack years: 28.00    Types: Cigarettes   Smokeless tobacco: Never  Vaping Use   Vaping Use: Never used  Substance and Sexual Activity   Alcohol use: Not Currently   Drug use: Never   Sexual activity: Not on file  Other Topics Concern   Not on file  Social History Narrative   Not on file  Social Determinants of Health   Financial Resource Strain: Not on file  Food Insecurity: Not on file  Transportation Needs: Not on file  Physical Activity: Not on file  Stress: Not on file  Social Connections: Not on file     Family History: The patient's family history includes Heart attack in her mother; Heart disease in her father and maternal grandmother; Hyperlipidemia in her mother; Hypertension in her mother; Kidney disease in her maternal aunt.  ROS:   Please see the history of present illness.     All other systems reviewed and are negative.  EKGs/Labs/Other Studies Reviewed:    The following studies were reviewed today:   EKG:  EKG is  ordered today.  The ekg ordered today demonstrates normal sinus rhythm, normal ECG  Recent Labs: 05/09/2022: BUN 12;  Creatinine, Ser 0.84; Hemoglobin 13.0; Platelets 392; Potassium 3.6; Sodium 135  Recent Lipid Panel No results found for: "CHOL", "TRIG", "HDL", "CHOLHDL", "VLDL", "LDLCALC", "LDLDIRECT"   Risk Assessment/Calculations:     HYPERTENSION CONTROL Vitals:   07/07/22 1015 07/07/22 1022  BP: (!) 162/92 (!) 164/90    The patient's blood pressure is elevated above target today.  In order to address the patient's elevated BP: Blood pressure will be monitored at home to determine if medication changes need to be made.            Physical Exam:    VS:  BP (!) 164/90 (BP Location: Right Arm, Patient Position: Sitting, Cuff Size: Normal)   Pulse 60   Ht 4\' 10"  (1.473 m)   Wt 138 lb 12.8 oz (63 kg)   SpO2 98%   BMI 29.01 kg/m     Wt Readings from Last 3 Encounters:  07/07/22 138 lb 12.8 oz (63 kg)  05/09/22 138 lb (62.6 kg)  08/16/18 132 lb (59.9 kg)     GEN:  Well nourished, well developed in no acute distress HEENT: Normal NECK: No JVD; No carotid bruits CARDIAC: RRR, no murmurs, rubs, gallops RESPIRATORY:  Clear to auscultation without rales, wheezing or rhonchi  ABDOMEN: Soft, non-tender, non-distended MUSCULOSKELETAL:  No edema; No deformity  SKIN: Warm and dry NEUROLOGIC:  Alert and oriented x 3 PSYCHIATRIC:  Normal affect   ASSESSMENT:    1. Pure hypercholesterolemia   2. Elevated BP without diagnosis of hypertension   3. Stenosis of carotid artery, unspecified laterality   4. Smoking    PLAN:    In order of problems listed above:  Hyperlipidemia, continue Lipitor 40 mg daily, obtain fasting lipid profile today. Elevated BP, low-salt diet strongly emphasized.  If stays elevated, start BP med at follow-up visit. Carotid stenosis, repeat carotid ultrasound.  Refer to vascular surgery.  Continue aspirin, Lipitor 40 mg daily. Current smoker, smoking cessation advised.  Follow-up in 6 to 8 weeks      Medication Adjustments/Labs and Tests Ordered: Current  medicines are reviewed at length with the patient today.  Concerns regarding medicines are outlined above.  Orders Placed This Encounter  Procedures   Lipid panel   Ambulatory referral to Vascular Surgery   EKG 12-Lead   VAS US CAROTID   Meds ordered this encounter  Medications   aspirin EC 81 MG tablet    Sig: Take 1 tablet (81 mg total) by mouth daily. Swallow whole.    Dispense:  90 tablet    Refill:  3    Patient Instructions  Medication Instructions:   Your physician recommends that you continue on your current  medications as directed. Please refer to the Current Medication list given to you today.  *If you need a refill on your cardiac medications before your next appointment, please call your pharmacy*   Lab Work:  Your physician recommends you go to the medical mall for labs - LIPID  If you have labs (blood work) drawn today and your tests are completely normal, you will receive your results only by: MyChart Message (if you have MyChart) OR A paper copy in the mail If you have any lab test that is abnormal or we need to change your treatment, we will call you to review the results.   Testing/Procedures:  Your physician has requested that you have a carotid duplex. This test is an ultrasound of the carotid arteries in your neck. It looks at blood flow through these arteries that supply the brain with blood. Allow one hour for this exam. There are no restrictions or special instructions.    Follow-Up: At [facility], you and your health needs are our priority.  As part of our continuing mission to provide you with exceptional heart care, we have created designated Provider Care Teams.  These Care Teams include your primary Cardiologist (physician) and Advanced Practice Providers (APPs -  Physician Assistants and Nurse Practitioners) who all work together to provide you with the care you need, when you need it.  We recommend signing up for the patient portal  called "MyChart".  Sign up information is provided on this After Visit Summary.  MyChart is used to connect with patients for Virtual Visits (Telemedicine).  Patients are able to view lab/test results, encounter notes, upcoming appointments, etc.  Non-urgent messages can be sent to your provider as well.   To learn more about what you can do with MyChart, go to [email or web address].    Your next appointment:   2 month(s)  Provider:   You may see Debbe Odea, MD or one of the following Advanced Practice Providers on your designated Care Team:   Nicolasa Ducking, NP Eula Listen, PA-C Cadence Fransico Michael, PA-C Charlsie Quest, NP   Signed, Debbe Odea, MD  07/07/2022 11:08 AM    [facility]

## 2022-07-08 ENCOUNTER — Telehealth: Payer: Self-pay | Admitting: Cardiology

## 2022-07-08 ENCOUNTER — Telehealth: Payer: Self-pay

## 2022-07-08 NOTE — Telephone Encounter (Signed)
Pt c/o medication issue:  1. Name of Medication:   Chantix  2. How are you currently taking this medication (dosage and times per day)?  Not taking as yet  3. Are you having a reaction (difficulty breathing--STAT)?   4. What is your medication issue?   Husband stated patient wants to try Chantix as discussed at last office visit.

## 2022-07-08 NOTE — Telephone Encounter (Signed)
-----   Message from Debbe Odea, MD sent at 07/07/2022  6:23 PM EDT ----- Cholesterol controlled, continue Lipitor as prescribed.

## 2022-07-08 NOTE — Telephone Encounter (Signed)
Called patient to review results as followed:  Debbe Odea, MD  Margrett Rud, CMA Cholesterol controlled, continue Lipitor as prescribed.  Patient also reported that she called her insurance and Chantix is covered 100% by her insurance and would like to know if that is something we can send in for her. Made her aware that I would make the provider aware and follow up with any recommendations

## 2022-07-11 NOTE — Telephone Encounter (Signed)
Spoke with patient Reviewed recommendations from Dr. Azucena Cecil as follow:  Please advise patient that I do not prescribe Chantix.  Recommend follow-up with PCP regarding Chantix prescription.  I however have prescribed nicotine patch in the past to my patients.  Thank you    Patient reports that she will contact PCP regarding Chantix and was very thankful for the return call.

## 2022-07-11 NOTE — Telephone Encounter (Signed)
Spoke with patient Reviewed recommendations from Dr. Agbor-Etang as follow:  Please advise patient that I do not prescribe Chantix.  Recommend follow-up with PCP regarding Chantix prescription.  I however have prescribed nicotine patch in the past to my patients.  Thank you    Patient reports that she will contact PCP regarding Chantix and was very thankful for the return call.  

## 2022-07-20 ENCOUNTER — Ambulatory Visit: Payer: Federal, State, Local not specified - PPO | Attending: Cardiology

## 2022-07-20 DIAGNOSIS — I6529 Occlusion and stenosis of unspecified carotid artery: Secondary | ICD-10-CM

## 2022-09-14 ENCOUNTER — Ambulatory Visit: Payer: Federal, State, Local not specified - PPO | Admitting: Cardiology

## 2022-11-08 DIAGNOSIS — M542 Cervicalgia: Secondary | ICD-10-CM | POA: Diagnosis not present

## 2022-11-08 DIAGNOSIS — M25511 Pain in right shoulder: Secondary | ICD-10-CM | POA: Diagnosis not present

## 2022-11-08 DIAGNOSIS — Z886 Allergy status to analgesic agent status: Secondary | ICD-10-CM | POA: Diagnosis not present

## 2022-11-08 DIAGNOSIS — Z79899 Other long term (current) drug therapy: Secondary | ICD-10-CM | POA: Diagnosis not present

## 2022-11-08 DIAGNOSIS — R0789 Other chest pain: Secondary | ICD-10-CM | POA: Diagnosis not present

## 2022-11-08 DIAGNOSIS — R079 Chest pain, unspecified: Secondary | ICD-10-CM | POA: Diagnosis not present

## 2022-11-08 DIAGNOSIS — F429 Obsessive-compulsive disorder, unspecified: Secondary | ICD-10-CM | POA: Diagnosis not present

## 2022-11-08 DIAGNOSIS — Z885 Allergy status to narcotic agent status: Secondary | ICD-10-CM | POA: Diagnosis not present

## 2022-11-08 DIAGNOSIS — Z7982 Long term (current) use of aspirin: Secondary | ICD-10-CM | POA: Diagnosis not present

## 2022-11-08 DIAGNOSIS — F17210 Nicotine dependence, cigarettes, uncomplicated: Secondary | ICD-10-CM | POA: Diagnosis not present

## 2022-11-08 DIAGNOSIS — M25512 Pain in left shoulder: Secondary | ICD-10-CM | POA: Diagnosis not present

## 2022-11-08 DIAGNOSIS — S20212A Contusion of left front wall of thorax, initial encounter: Secondary | ICD-10-CM | POA: Diagnosis not present

## 2022-11-08 DIAGNOSIS — E785 Hyperlipidemia, unspecified: Secondary | ICD-10-CM | POA: Diagnosis not present

## 2022-11-08 DIAGNOSIS — S2002XA Contusion of left breast, initial encounter: Secondary | ICD-10-CM | POA: Diagnosis not present

## 2022-11-08 DIAGNOSIS — N644 Mastodynia: Secondary | ICD-10-CM | POA: Diagnosis not present

## 2022-11-22 DIAGNOSIS — M542 Cervicalgia: Secondary | ICD-10-CM | POA: Diagnosis not present

## 2022-11-22 DIAGNOSIS — M9903 Segmental and somatic dysfunction of lumbar region: Secondary | ICD-10-CM | POA: Diagnosis not present

## 2022-11-22 DIAGNOSIS — M9902 Segmental and somatic dysfunction of thoracic region: Secondary | ICD-10-CM | POA: Diagnosis not present

## 2022-11-22 DIAGNOSIS — M9901 Segmental and somatic dysfunction of cervical region: Secondary | ICD-10-CM | POA: Diagnosis not present

## 2022-11-23 DIAGNOSIS — M9901 Segmental and somatic dysfunction of cervical region: Secondary | ICD-10-CM | POA: Diagnosis not present

## 2022-11-23 DIAGNOSIS — M542 Cervicalgia: Secondary | ICD-10-CM | POA: Diagnosis not present

## 2022-11-23 DIAGNOSIS — M9903 Segmental and somatic dysfunction of lumbar region: Secondary | ICD-10-CM | POA: Diagnosis not present

## 2022-11-23 DIAGNOSIS — M9902 Segmental and somatic dysfunction of thoracic region: Secondary | ICD-10-CM | POA: Diagnosis not present

## 2022-11-24 DIAGNOSIS — M9903 Segmental and somatic dysfunction of lumbar region: Secondary | ICD-10-CM | POA: Diagnosis not present

## 2022-11-24 DIAGNOSIS — M9901 Segmental and somatic dysfunction of cervical region: Secondary | ICD-10-CM | POA: Diagnosis not present

## 2022-11-24 DIAGNOSIS — M542 Cervicalgia: Secondary | ICD-10-CM | POA: Diagnosis not present

## 2022-11-24 DIAGNOSIS — M9902 Segmental and somatic dysfunction of thoracic region: Secondary | ICD-10-CM | POA: Diagnosis not present

## 2022-11-28 DIAGNOSIS — M9903 Segmental and somatic dysfunction of lumbar region: Secondary | ICD-10-CM | POA: Diagnosis not present

## 2022-11-28 DIAGNOSIS — M9901 Segmental and somatic dysfunction of cervical region: Secondary | ICD-10-CM | POA: Diagnosis not present

## 2022-11-28 DIAGNOSIS — M9902 Segmental and somatic dysfunction of thoracic region: Secondary | ICD-10-CM | POA: Diagnosis not present

## 2022-11-28 DIAGNOSIS — M542 Cervicalgia: Secondary | ICD-10-CM | POA: Diagnosis not present

## 2022-11-30 DIAGNOSIS — M542 Cervicalgia: Secondary | ICD-10-CM | POA: Diagnosis not present

## 2022-11-30 DIAGNOSIS — M9901 Segmental and somatic dysfunction of cervical region: Secondary | ICD-10-CM | POA: Diagnosis not present

## 2022-11-30 DIAGNOSIS — M9902 Segmental and somatic dysfunction of thoracic region: Secondary | ICD-10-CM | POA: Diagnosis not present

## 2022-11-30 DIAGNOSIS — M9903 Segmental and somatic dysfunction of lumbar region: Secondary | ICD-10-CM | POA: Diagnosis not present

## 2022-12-02 DIAGNOSIS — M9903 Segmental and somatic dysfunction of lumbar region: Secondary | ICD-10-CM | POA: Diagnosis not present

## 2022-12-02 DIAGNOSIS — M542 Cervicalgia: Secondary | ICD-10-CM | POA: Diagnosis not present

## 2022-12-02 DIAGNOSIS — M9901 Segmental and somatic dysfunction of cervical region: Secondary | ICD-10-CM | POA: Diagnosis not present

## 2022-12-02 DIAGNOSIS — M9902 Segmental and somatic dysfunction of thoracic region: Secondary | ICD-10-CM | POA: Diagnosis not present

## 2022-12-05 DIAGNOSIS — M9903 Segmental and somatic dysfunction of lumbar region: Secondary | ICD-10-CM | POA: Diagnosis not present

## 2022-12-05 DIAGNOSIS — M9902 Segmental and somatic dysfunction of thoracic region: Secondary | ICD-10-CM | POA: Diagnosis not present

## 2022-12-05 DIAGNOSIS — M9901 Segmental and somatic dysfunction of cervical region: Secondary | ICD-10-CM | POA: Diagnosis not present

## 2022-12-05 DIAGNOSIS — M542 Cervicalgia: Secondary | ICD-10-CM | POA: Diagnosis not present

## 2022-12-07 DIAGNOSIS — M9901 Segmental and somatic dysfunction of cervical region: Secondary | ICD-10-CM | POA: Diagnosis not present

## 2022-12-07 DIAGNOSIS — M9902 Segmental and somatic dysfunction of thoracic region: Secondary | ICD-10-CM | POA: Diagnosis not present

## 2022-12-07 DIAGNOSIS — M9903 Segmental and somatic dysfunction of lumbar region: Secondary | ICD-10-CM | POA: Diagnosis not present

## 2022-12-07 DIAGNOSIS — M542 Cervicalgia: Secondary | ICD-10-CM | POA: Diagnosis not present

## 2022-12-09 DIAGNOSIS — M9903 Segmental and somatic dysfunction of lumbar region: Secondary | ICD-10-CM | POA: Diagnosis not present

## 2022-12-09 DIAGNOSIS — M9902 Segmental and somatic dysfunction of thoracic region: Secondary | ICD-10-CM | POA: Diagnosis not present

## 2022-12-09 DIAGNOSIS — M542 Cervicalgia: Secondary | ICD-10-CM | POA: Diagnosis not present

## 2022-12-09 DIAGNOSIS — M9901 Segmental and somatic dysfunction of cervical region: Secondary | ICD-10-CM | POA: Diagnosis not present

## 2022-12-12 DIAGNOSIS — M9903 Segmental and somatic dysfunction of lumbar region: Secondary | ICD-10-CM | POA: Diagnosis not present

## 2022-12-12 DIAGNOSIS — M9901 Segmental and somatic dysfunction of cervical region: Secondary | ICD-10-CM | POA: Diagnosis not present

## 2022-12-12 DIAGNOSIS — M9902 Segmental and somatic dysfunction of thoracic region: Secondary | ICD-10-CM | POA: Diagnosis not present

## 2022-12-12 DIAGNOSIS — M542 Cervicalgia: Secondary | ICD-10-CM | POA: Diagnosis not present

## 2022-12-14 DIAGNOSIS — M9903 Segmental and somatic dysfunction of lumbar region: Secondary | ICD-10-CM | POA: Diagnosis not present

## 2022-12-14 DIAGNOSIS — M9902 Segmental and somatic dysfunction of thoracic region: Secondary | ICD-10-CM | POA: Diagnosis not present

## 2022-12-14 DIAGNOSIS — M542 Cervicalgia: Secondary | ICD-10-CM | POA: Diagnosis not present

## 2022-12-14 DIAGNOSIS — M9901 Segmental and somatic dysfunction of cervical region: Secondary | ICD-10-CM | POA: Diagnosis not present

## 2022-12-16 DIAGNOSIS — M9902 Segmental and somatic dysfunction of thoracic region: Secondary | ICD-10-CM | POA: Diagnosis not present

## 2022-12-16 DIAGNOSIS — M542 Cervicalgia: Secondary | ICD-10-CM | POA: Diagnosis not present

## 2022-12-16 DIAGNOSIS — M9901 Segmental and somatic dysfunction of cervical region: Secondary | ICD-10-CM | POA: Diagnosis not present

## 2022-12-16 DIAGNOSIS — M9903 Segmental and somatic dysfunction of lumbar region: Secondary | ICD-10-CM | POA: Diagnosis not present

## 2022-12-19 DIAGNOSIS — M9903 Segmental and somatic dysfunction of lumbar region: Secondary | ICD-10-CM | POA: Diagnosis not present

## 2022-12-19 DIAGNOSIS — M9901 Segmental and somatic dysfunction of cervical region: Secondary | ICD-10-CM | POA: Diagnosis not present

## 2022-12-19 DIAGNOSIS — M9902 Segmental and somatic dysfunction of thoracic region: Secondary | ICD-10-CM | POA: Diagnosis not present

## 2022-12-19 DIAGNOSIS — M542 Cervicalgia: Secondary | ICD-10-CM | POA: Diagnosis not present

## 2022-12-21 DIAGNOSIS — M9902 Segmental and somatic dysfunction of thoracic region: Secondary | ICD-10-CM | POA: Diagnosis not present

## 2022-12-21 DIAGNOSIS — M9903 Segmental and somatic dysfunction of lumbar region: Secondary | ICD-10-CM | POA: Diagnosis not present

## 2022-12-21 DIAGNOSIS — M542 Cervicalgia: Secondary | ICD-10-CM | POA: Diagnosis not present

## 2022-12-21 DIAGNOSIS — M9901 Segmental and somatic dysfunction of cervical region: Secondary | ICD-10-CM | POA: Diagnosis not present

## 2022-12-26 DIAGNOSIS — M9903 Segmental and somatic dysfunction of lumbar region: Secondary | ICD-10-CM | POA: Diagnosis not present

## 2022-12-26 DIAGNOSIS — M9902 Segmental and somatic dysfunction of thoracic region: Secondary | ICD-10-CM | POA: Diagnosis not present

## 2022-12-26 DIAGNOSIS — M542 Cervicalgia: Secondary | ICD-10-CM | POA: Diagnosis not present

## 2022-12-26 DIAGNOSIS — M9901 Segmental and somatic dysfunction of cervical region: Secondary | ICD-10-CM | POA: Diagnosis not present

## 2022-12-28 DIAGNOSIS — M542 Cervicalgia: Secondary | ICD-10-CM | POA: Diagnosis not present

## 2022-12-28 DIAGNOSIS — M9903 Segmental and somatic dysfunction of lumbar region: Secondary | ICD-10-CM | POA: Diagnosis not present

## 2022-12-28 DIAGNOSIS — M9901 Segmental and somatic dysfunction of cervical region: Secondary | ICD-10-CM | POA: Diagnosis not present

## 2022-12-28 DIAGNOSIS — M9902 Segmental and somatic dysfunction of thoracic region: Secondary | ICD-10-CM | POA: Diagnosis not present

## 2023-01-02 DIAGNOSIS — M546 Pain in thoracic spine: Secondary | ICD-10-CM | POA: Diagnosis not present

## 2023-01-02 DIAGNOSIS — M542 Cervicalgia: Secondary | ICD-10-CM | POA: Diagnosis not present

## 2023-01-05 DIAGNOSIS — M542 Cervicalgia: Secondary | ICD-10-CM | POA: Diagnosis not present

## 2023-01-05 DIAGNOSIS — M546 Pain in thoracic spine: Secondary | ICD-10-CM | POA: Diagnosis not present

## 2023-01-12 DIAGNOSIS — M542 Cervicalgia: Secondary | ICD-10-CM | POA: Diagnosis not present

## 2023-01-12 DIAGNOSIS — M546 Pain in thoracic spine: Secondary | ICD-10-CM | POA: Diagnosis not present

## 2023-01-13 DIAGNOSIS — M546 Pain in thoracic spine: Secondary | ICD-10-CM | POA: Diagnosis not present

## 2023-01-13 DIAGNOSIS — M542 Cervicalgia: Secondary | ICD-10-CM | POA: Diagnosis not present

## 2023-01-16 DIAGNOSIS — M546 Pain in thoracic spine: Secondary | ICD-10-CM | POA: Diagnosis not present

## 2023-01-16 DIAGNOSIS — M542 Cervicalgia: Secondary | ICD-10-CM | POA: Diagnosis not present

## 2023-01-23 ENCOUNTER — Emergency Department
Admission: EM | Admit: 2023-01-23 | Discharge: 2023-01-23 | Disposition: A | Discharge status: Home / Self Care | Payer: Federal, State, Local not specified - PPO | Source: Home / Self Care

## 2023-01-23 DIAGNOSIS — M549 Dorsalgia, unspecified: Secondary | ICD-10-CM | POA: Diagnosis not present

## 2023-01-24 ENCOUNTER — Ambulatory Visit (HOSPITAL_BASED_OUTPATIENT_CLINIC_OR_DEPARTMENT_OTHER): Payer: Federal, State, Local not specified - PPO

## 2023-01-24 ENCOUNTER — Ambulatory Visit (INDEPENDENT_AMBULATORY_CARE_PROVIDER_SITE_OTHER): Payer: Federal, State, Local not specified - PPO | Admitting: Student

## 2023-01-24 ENCOUNTER — Encounter (HOSPITAL_BASED_OUTPATIENT_CLINIC_OR_DEPARTMENT_OTHER): Payer: Self-pay | Admitting: Student

## 2023-01-24 DIAGNOSIS — M545 Low back pain, unspecified: Secondary | ICD-10-CM

## 2023-01-24 DIAGNOSIS — M546 Pain in thoracic spine: Secondary | ICD-10-CM

## 2023-01-24 DIAGNOSIS — M419 Scoliosis, unspecified: Secondary | ICD-10-CM | POA: Diagnosis not present

## 2023-01-24 DIAGNOSIS — M47814 Spondylosis without myelopathy or radiculopathy, thoracic region: Secondary | ICD-10-CM | POA: Diagnosis not present

## 2023-01-24 DIAGNOSIS — M549 Dorsalgia, unspecified: Secondary | ICD-10-CM
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
## 2023-01-30 DIAGNOSIS — M546 Pain in thoracic spine: Secondary | ICD-10-CM | POA: Diagnosis not present

## 2023-01-30 DIAGNOSIS — M542 Cervicalgia: Secondary | ICD-10-CM | POA: Diagnosis not present

## 2023-02-01 DIAGNOSIS — M542 Cervicalgia: Secondary | ICD-10-CM | POA: Diagnosis not present

## 2023-02-01 DIAGNOSIS — M546 Pain in thoracic spine: Secondary | ICD-10-CM | POA: Diagnosis not present

## 2023-02-03 DIAGNOSIS — M546 Pain in thoracic spine: Secondary | ICD-10-CM | POA: Diagnosis not present

## 2023-02-03 DIAGNOSIS — M542 Cervicalgia: Secondary | ICD-10-CM | POA: Diagnosis not present

## 2023-02-10 ENCOUNTER — Ambulatory Visit
Admission: RE | Admit: 2023-02-10 | Discharge: 2023-02-10 | Disposition: A | Discharge status: Home / Self Care | Payer: Federal, State, Local not specified - PPO | Source: Ambulatory Visit | Attending: Student | Admitting: Student

## 2023-02-10 DIAGNOSIS — M47814 Spondylosis without myelopathy or radiculopathy, thoracic region: Secondary | ICD-10-CM | POA: Diagnosis not present

## 2023-02-10 DIAGNOSIS — M546 Pain in thoracic spine: Secondary | ICD-10-CM

## 2023-02-10 DIAGNOSIS — M4804 Spinal stenosis, thoracic region: Secondary | ICD-10-CM | POA: Diagnosis not present

## 2023-02-13 ENCOUNTER — Encounter: Payer: Self-pay | Admitting: Physician Assistant

## 2023-02-13 ENCOUNTER — Ambulatory Visit: Payer: Federal, State, Local not specified - PPO | Admitting: Physician Assistant

## 2023-02-13 DIAGNOSIS — M546 Pain in thoracic spine: Secondary | ICD-10-CM | POA: Diagnosis not present

## 2023-02-13 DIAGNOSIS — M5414 Radiculopathy, thoracic region: Secondary | ICD-10-CM | POA: Diagnosis not present

## 2023-02-13 DIAGNOSIS — M542 Cervicalgia: Secondary | ICD-10-CM | POA: Diagnosis not present

## 2023-02-13 MED ORDER — GABAPENTIN 300 MG PO CAPS: 300.0000 mg | ORAL_CAPSULE | Freq: Three times a day (TID) | ORAL | 0 refills | Status: AC
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
## 2023-02-14 ENCOUNTER — Other Ambulatory Visit: Payer: Self-pay | Admitting: Radiology

## 2023-02-14 DIAGNOSIS — M5414 Radiculopathy, thoracic region: Secondary | ICD-10-CM

## 2023-02-15 ENCOUNTER — Telehealth: Payer: Self-pay | Admitting: Physical Medicine and Rehabilitation

## 2023-02-15 NOTE — Telephone Encounter (Signed)
Patient called to schedule an appointment with Dr. Alvester Morin for her back. The number to contact patient is [phone]

## 2023-02-23 ENCOUNTER — Ambulatory Visit: Payer: Federal, State, Local not specified - PPO | Admitting: Orthopedic Surgery

## 2023-02-27 DIAGNOSIS — H5712 Ocular pain, left eye: Secondary | ICD-10-CM | POA: Diagnosis not present

## 2023-02-27 DIAGNOSIS — S0502XA Injury of conjunctiva and corneal abrasion without foreign body, left eye, initial encounter: Secondary | ICD-10-CM | POA: Diagnosis not present

## 2023-02-28 DIAGNOSIS — S0501XD Injury of conjunctiva and corneal abrasion without foreign body, right eye, subsequent encounter: Secondary | ICD-10-CM | POA: Diagnosis not present

## 2023-03-03 DIAGNOSIS — S0502XD Injury of conjunctiva and corneal abrasion without foreign body, left eye, subsequent encounter: Secondary | ICD-10-CM | POA: Diagnosis not present

## 2023-03-06 ENCOUNTER — Ambulatory Visit: Payer: Federal, State, Local not specified - PPO | Admitting: Physical Medicine and Rehabilitation

## 2023-03-06 ENCOUNTER — Other Ambulatory Visit: Payer: Self-pay

## 2023-03-06 DIAGNOSIS — M5414 Radiculopathy, thoracic region: Secondary | ICD-10-CM

## 2023-03-06 DIAGNOSIS — M546 Pain in thoracic spine: Secondary | ICD-10-CM | POA: Diagnosis not present

## 2023-03-06 MED ORDER — DEXAMETHASONE SODIUM PHOSPHATE 10 MG/ML IJ SOLN
15.0000 mg | Freq: Once | INTRAMUSCULAR | Status: AC
Start: 2023-03-06 — End: 2023-03-06
  Administered 2023-03-06: 15 mg

## 2023-03-06 MED ORDER — METHYLPREDNISOLONE ACETATE 40 MG/ML IJ SUSP
40.0000 mg | Freq: Once | INTRAMUSCULAR | Status: DC
Start: 2023-03-06 — End: 2023-03-06

## 2023-03-06 NOTE — Progress Notes (Signed)
 Functional Pain Scale - descriptive words and definitions  Moderate (4)   Constantly aware of pain, can complete ADLs with modification/sleep marginally affected at times/passive distraction is of no use, but active distraction gives some relief. Moderate range order  Average Pain 4  R > L  No numbness / tingling.  +Driver, -BT, -Dye Allergies.

## 2023-03-06 NOTE — Patient Instructions (Signed)

## 2023-03-09 DIAGNOSIS — S0502XD Injury of conjunctiva and corneal abrasion without foreign body, left eye, subsequent encounter: Secondary | ICD-10-CM | POA: Diagnosis not present

## 2023-03-15 ENCOUNTER — Other Ambulatory Visit: Payer: Self-pay

## 2023-03-15 DIAGNOSIS — R1011 Right upper quadrant pain: Secondary | ICD-10-CM

## 2023-03-18 NOTE — Progress Notes (Signed)
 Mercedes Stevens - 58 y.o. female MRN [id number]  Date of birth: Dec 19, 1965  Office Visit Note: Visit Date: 03/06/2023 PCP: Patient, No Pcp Per Referred by: Kirtland Bouchard, PA-C  Subjective: Chief Complaint  Patient presents with   Middle Back - Pain   HPI:  Mercedes Stevens is a 58 y.o. female who comes in today at the request of Rexene Edison, PA-C for planned Right T8-9 Lumbar Transforaminal epidural steroid injection with fluoroscopic guidance.  The patient has failed conservative care including home exercise, medications, time and activity modification.  This injection will be diagnostic and hopefully therapeutic.  Please see requesting physician notes for further details and justification.   ROS Otherwise per HPI.  Assessment & Plan: Visit Diagnoses:    ICD-10-CM   1. Thoracic radiculopathy  M54.14 XR C-ARM NO REPORT    Epidural Steroid injection    dexamethasone (DECADRON) injection 15 mg    DISCONTINUED: methylPREDNISolone acetate (DEPO-MEDROL) injection 40 mg    2. Pain in thoracic spine  M54.6 XR C-ARM NO REPORT    Epidural Steroid injection    dexamethasone (DECADRON) injection 15 mg    DISCONTINUED: methylPREDNISolone acetate (DEPO-MEDROL) injection 40 mg      Plan: No additional findings.   Meds & Orders:  Meds ordered this encounter  Medications   DISCONTD: methylPREDNISolone acetate (DEPO-MEDROL) injection 40 mg   dexamethasone (DECADRON) injection 15 mg    Orders Placed This Encounter  Procedures   XR C-ARM NO REPORT   Epidural Steroid injection    Follow-up: Return for visit to requesting provider as needed.   Procedures: No procedures performed  Thoracic Transforaminal Epidural Steroid Injection - Infraneural Approach with Fluoroscopic Guidance  Patient: Mercedes Stevens      Date of Birth: 12-26-1965 MRN: [id number] PCP: Patient, No Pcp Per      Visit Date: 03/06/2023   Universal Protocol:    Date/Time: 03/06/2023  Consent Given By: the  patient  Position: PRONE  Additional Comments: Vital signs were monitored before and after the procedure. Patient was prepped and draped in the usual sterile fashion. The correct patient, procedure, and site was verified.   Injection Procedure Details:   Procedure diagnoses: Thoracic radiculopathy [M54.14]    Meds Administered:  Meds ordered this encounter  Medications   DISCONTD: methylPREDNISolone acetate (DEPO-MEDROL) injection 40 mg   dexamethasone (DECADRON) injection 15 mg    Laterality: Right  Location/Site:  T8-9  Needle:3.5 in., 22 ga.  Short bevel or Quincke spinal needle  Needle Placement: Transforaminal  Findings:    -Comments: Excellent flow of contrast along the nerve, nerve root and into the epidural space.  Procedure Details: After squaring off the end-plates of the desired vertebral level to get a true AP view, the C-arm was obliqued to the painful side so that the superior articulating process is positioned about 1/3 the length inferior endplate.  The needle should be aimed toward the junction of the superior articular process and the transverse process of the inferior vertebrae. The needle's initial entry is in the lower third of the foramen or "low in the hole." The soft tissues overlying this target were infiltrated with 2-3 ml. of 1% Lidocaine without Epinephrine.  The spinal needle was then inserted and advanced toward the target using a "trajectory" view along the fluoroscope beam.  Under AP and lateral visualization, the needle was advanced so it did not puncture dura and did not traverse medially beyond the 6 o'clock position of the pedicle and just  the SAP on lateral view. Bi-planar projections were used to confirm position. Aspiration was confirmed to be negative for CSF and/or blood. A 1-2 ml. volume of Isovue-250 was injected and flow of contrast was noted at each level. Radiographs were obtained for documentation purposes.   After attaining the  desired flow of contrast documented above, a 0.5 to 1.0 ml test dose of 0.25% Marcaine was injected into each respective transforaminal space.  The patient was observed for 90 seconds post injection.  After no sensory deficits were reported, and normal lower extremity motor function was noted,   the above injectate was administered so that equal amounts of the injectate were placed at each foramen (level) into the transforaminal epidural space.   Additional Comments:  No complications occurred Dressing: Band-Aid    Post-procedure details: Patient was observed during the procedure. Post-procedure instructions were reviewed. Follow-Up Instructions: Patient handout Patient left the clinic in stable condition.        Clinical History: No specialty comments available.     Objective:  VS:  HT:    WT:   BMI:     BP:   HR: bpm  TEMP: ( )  RESP:  Physical Exam Vitals and nursing note reviewed.  Constitutional:      General: She is not in acute distress.    Appearance: Normal appearance. She is not ill-appearing.  HENT:     Head: Normocephalic and atraumatic.     Right Ear: External ear normal.     Left Ear: External ear normal.  Eyes:     Extraocular Movements: Extraocular movements intact.  Cardiovascular:     Rate and Rhythm: Normal rate.     Pulses: Normal pulses.  Pulmonary:     Effort: Pulmonary effort is normal. No respiratory distress.  Abdominal:     General: There is no distension.     Palpations: Abdomen is soft.  Musculoskeletal:        General: Tenderness present.     Cervical back: Neck supple.     Right lower leg: No edema.     Left lower leg: No edema.     Comments: Patient has good distal strength with no pain over the greater trochanters.  No clonus or focal weakness.  Skin:    Findings: No erythema, lesion or rash.  Neurological:     General: No focal deficit present.     Mental Status: She is alert and oriented to person, place, and time.      Sensory: No sensory deficit.     Motor: No weakness or abnormal muscle tone.     Coordination: Coordination normal.  Psychiatric:        Mood and Affect: Mood normal.        Behavior: Behavior normal.      Imaging: No results found.

## 2023-03-18 NOTE — Procedures (Signed)
 Thoracic Transforaminal Epidural Steroid Injection - Infraneural Approach with Fluoroscopic Guidance  Patient: Mercedes Stevens      Date of Birth: 31-May-1965 MRN: [id number] PCP: Patient, No Pcp Per      Visit Date: 03/06/2023   Universal Protocol:    Date/Time: 03/06/2023  Consent Given By: the patient  Position: PRONE  Additional Comments: Vital signs were monitored before and after the procedure. Patient was prepped and draped in the usual sterile fashion. The correct patient, procedure, and site was verified.   Injection Procedure Details:   Procedure diagnoses: Thoracic radiculopathy [M54.14]    Meds Administered:  Meds ordered this encounter  Medications   DISCONTD: methylPREDNISolone acetate (DEPO-MEDROL) injection 40 mg   dexamethasone (DECADRON) injection 15 mg    Laterality: Right  Location/Site:  T8-9  Needle:3.5 in., 22 ga.  Short bevel or Quincke spinal needle  Needle Placement: Transforaminal  Findings:    -Comments: Excellent flow of contrast along the nerve, nerve root and into the epidural space.  Procedure Details: After squaring off the end-plates of the desired vertebral level to get a true AP view, the C-arm was obliqued to the painful side so that the superior articulating process is positioned about 1/3 the length inferior endplate.  The needle should be aimed toward the junction of the superior articular process and the transverse process of the inferior vertebrae. The needle's initial entry is in the lower third of the foramen or "low in the hole." The soft tissues overlying this target were infiltrated with 2-3 ml. of 1% Lidocaine without Epinephrine.  The spinal needle was then inserted and advanced toward the target using a "trajectory" view along the fluoroscope beam.  Under AP and lateral visualization, the needle was advanced so it did not puncture dura and did not traverse medially beyond the 6 o'clock position of the pedicle and just the SAP  on lateral view. Bi-planar projections were used to confirm position. Aspiration was confirmed to be negative for CSF and/or blood. A 1-2 ml. volume of Isovue-250 was injected and flow of contrast was noted at each level. Radiographs were obtained for documentation purposes.   After attaining the desired flow of contrast documented above, a 0.5 to 1.0 ml test dose of 0.25% Marcaine was injected into each respective transforaminal space.  The patient was observed for 90 seconds post injection.  After no sensory deficits were reported, and normal lower extremity motor function was noted,   the above injectate was administered so that equal amounts of the injectate were placed at each foramen (level) into the transforaminal epidural space.   Additional Comments:  No complications occurred Dressing: Band-Aid    Post-procedure details: Patient was observed during the procedure. Post-procedure instructions were reviewed. Follow-Up Instructions: Patient handout Patient left the clinic in stable condition.

## 2023-03-30 ENCOUNTER — Ambulatory Visit: Admitting: Orthopedic Surgery

## 2023-03-30 VITALS — BP 156/95 | HR 78 | Ht <= 58 in | Wt 145.0 lb

## 2023-03-30 DIAGNOSIS — M549 Dorsalgia, unspecified: Secondary | ICD-10-CM
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
## 2023-04-03 ENCOUNTER — Telehealth: Payer: Self-pay | Admitting: Orthopedic Surgery

## 2023-04-03 NOTE — Telephone Encounter (Signed)
 Patient called requesting for a call back regarding a referral please. Mercedes Stevens [phone]

## 2023-04-04 NOTE — Telephone Encounter (Signed)
 I tried to call pt, no answer. Looks like referral was denied due to her having the chronic back pain. Please advise

## 2023-04-05 ENCOUNTER — Ambulatory Visit: Admitting: Physician Assistant

## 2023-04-05 NOTE — Telephone Encounter (Signed)
Tried to call, no answer and no VM

## 2023-04-07 ENCOUNTER — Telehealth: Payer: Self-pay | Admitting: Orthopedic Surgery
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
## 2023-04-07 NOTE — Telephone Encounter (Signed)
 Patient wants to know for what duration of time, will she need to continue getting the spine injections. Please note it in her chart for her records and her lawyer.

## 2023-04-08 ENCOUNTER — Encounter: Payer: Self-pay | Admitting: Orthopedic Surgery

## 2023-06-05 ENCOUNTER — Telehealth: Payer: Self-pay | Admitting: Physical Medicine and Rehabilitation

## 2023-06-05 ENCOUNTER — Ambulatory Visit: Admitting: Physician Assistant

## 2023-06-05 DIAGNOSIS — M5414 Radiculopathy, thoracic region: Secondary | ICD-10-CM

## 2023-06-05 NOTE — Telephone Encounter (Signed)
 Pt is requesting an appt with [facility].

## 2023-06-07 ENCOUNTER — Encounter: Payer: Self-pay | Admitting: Physical Medicine and Rehabilitation

## 2023-06-28 ENCOUNTER — Other Ambulatory Visit: Payer: Self-pay

## 2023-06-28 ENCOUNTER — Ambulatory Visit: Admitting: Physical Medicine and Rehabilitation

## 2023-06-28 VITALS — BP 150/87 | HR 71

## 2023-06-28 DIAGNOSIS — M5414 Radiculopathy, thoracic region: Secondary | ICD-10-CM

## 2023-06-28 MED ORDER — DEXAMETHASONE SODIUM PHOSPHATE 10 MG/ML IJ SOLN
15.0000 mg | Freq: Once | INTRAMUSCULAR | Status: AC
  Administered 2023-06-28: 15 mg

## 2023-06-28 NOTE — Progress Notes (Unsigned)
 Pain Scale   Average Pain 7 Patient advising her middle back pain is Chronic without relief. Pt does however advise that the injections due relieve the pain.        +Driver, -BT, -Dye Allergies.

## 2023-06-28 NOTE — Patient Instructions (Signed)

## 2023-06-29 NOTE — Progress Notes (Signed)
 Mercedes Stevens - 58 y.o. female MRN [id number]  Date of birth: 04-Jan-1966  Office Visit Note: Visit Date: 06/28/2023 PCP: Patient, No Pcp Per Referred by: Diedra Fowler, MD  Subjective: Chief Complaint  Patient presents with   Middle Back - Pain   HPI:  Mercedes Stevens is a 58 y.o. female who comes in today for planned repeat Right T8-9  Thoracic Transforaminal epidural steroid injection with fluoroscopic guidance.  The patient has failed conservative care including home exercise, medications, time and activity modification.  This injection will be diagnostic and hopefully therapeutic.  Please see requesting physician notes for further details and justification. Patient received more than 50% pain relief from prior injection.   Referring: Dr. Colette Davies   ROS Otherwise per HPI.  Assessment & Plan: Visit Diagnoses:    ICD-10-CM   1. Thoracic radiculopathy  M54.14 XR C-ARM NO REPORT    Epidural Steroid injection    dexamethasone  (DECADRON ) injection 15 mg      Plan: No additional findings.   Meds & Orders:  Meds ordered this encounter  Medications   dexamethasone  (DECADRON ) injection 15 mg    Orders Placed This Encounter  Procedures   XR C-ARM NO REPORT   Epidural Steroid injection    Follow-up: Return for visit to requesting provider as needed.   Procedures: No procedures performed  Thoracic Transforaminal Epidural Steroid Injection - Infraneural Approach with Fluoroscopic Guidance  Patient: Mercedes Stevens      Date of Birth: 07/22/1965 MRN: [id number] PCP: Patient, No Pcp Per      Visit Date: 06/28/2023   Universal Protocol:    Date/Time: 06/28/2023  Consent Given By: the patient  Position: PRONE  Additional Comments: Vital signs were monitored before and after the procedure. Patient was prepped and draped in the usual sterile fashion. The correct patient, procedure, and site was verified.   Injection Procedure Details:   Procedure diagnoses: Thoracic  radiculopathy [M54.14]    Meds Administered:  Meds ordered this encounter  Medications   dexamethasone  (DECADRON ) injection 15 mg    Laterality: Right  Location/Site:  T8-9  Needle:3.5 in., 22 ga.  Short bevel or Quincke spinal needle  Needle Placement: Transforaminal  Findings:    -Comments: Excellent flow of contrast along the nerve, nerve root and into the epidural space.  Procedure Details: After squaring off the end-plates of the desired vertebral level to get a true AP view, the C-arm was obliqued to the painful side so that the superior articulating process is positioned about 1/3 the length inferior endplate.  The needle should be aimed toward the junction of the superior articular process and the transverse process of the inferior vertebrae. The needle's initial entry is in the lower third of the foramen or "low in the hole." The soft tissues overlying this target were infiltrated with 2-3 ml. of 1% Lidocaine  without Epinephrine.  The spinal needle was then inserted and advanced toward the target using a "trajectory" view along the fluoroscope beam.  Under AP and lateral visualization, the needle was advanced so it did not puncture dura and did not traverse medially beyond the 6 o'clock position of the pedicle and just the SAP on lateral view. Bi-planar projections were used to confirm position. Aspiration was confirmed to be negative for CSF and/or blood. A 1-2 ml. volume of Isovue-250 was injected and flow of contrast was noted at each level. Radiographs were obtained for documentation purposes.   After attaining the desired flow of contrast documented  above, a 0.5 to 1.0 ml test dose of 0.25% Marcaine was injected into each respective transforaminal space.  The patient was observed for 90 seconds post injection.  After no sensory deficits were reported, and normal lower extremity motor function was noted,   the above injectate was administered so that equal amounts of the  injectate were placed at each foramen (level) into the transforaminal epidural space.   Additional Comments:  No complications occurred Dressing: Band-Aid    Post-procedure details: Patient was observed during the procedure. Post-procedure instructions were reviewed. Follow-Up Instructions: Patient handout Patient left the clinic in stable condition.       Clinical History: No specialty comments available.     Objective:  VS:  HT:    WT:   BMI:     BP:(!) 150/87  HR:71bpm  TEMP: ( )  RESP:  Physical Exam Vitals and nursing note reviewed.  Constitutional:      General: She is not in acute distress.    Appearance: Normal appearance. She is not ill-appearing.  HENT:     Head: Normocephalic and atraumatic.     Right Ear: External ear normal.     Left Ear: External ear normal.  Eyes:     Extraocular Movements: Extraocular movements intact.  Cardiovascular:     Rate and Rhythm: Normal rate.     Pulses: Normal pulses.  Pulmonary:     Effort: Pulmonary effort is normal. No respiratory distress.  Abdominal:     General: There is no distension.     Palpations: Abdomen is soft.  Musculoskeletal:        General: Tenderness present.     Cervical back: Neck supple.     Right lower leg: No edema.     Left lower leg: No edema.     Comments: Patient has good distal strength with no pain over the greater trochanters.  No clonus or focal weakness.  Skin:    Findings: No erythema, lesion or rash.  Neurological:     General: No focal deficit present.     Mental Status: She is alert and oriented to person, place, and time.     Sensory: No sensory deficit.     Motor: No weakness or abnormal muscle tone.     Coordination: Coordination normal.  Psychiatric:        Mood and Affect: Mood normal.        Behavior: Behavior normal.      Imaging: XR C-ARM NO REPORT Result Date: 06/28/2023 Please see Notes tab for imaging impression.

## 2023-06-29 NOTE — Procedures (Signed)
 Thoracic Transforaminal Epidural Steroid Injection - Infraneural Approach with Fluoroscopic Guidance  Patient: Mercedes Stevens      Date of Birth: 1965/10/09 MRN: [id number] PCP: Patient, No Pcp Per      Visit Date: 06/28/2023   Universal Protocol:    Date/Time: 06/28/2023  Consent Given By: the patient  Position: PRONE  Additional Comments: Vital signs were monitored before and after the procedure. Patient was prepped and draped in the usual sterile fashion. The correct patient, procedure, and site was verified.   Injection Procedure Details:   Procedure diagnoses: Thoracic radiculopathy [M54.14]    Meds Administered:  Meds ordered this encounter  Medications   dexamethasone  (DECADRON ) injection 15 mg    Laterality: Right  Location/Site:  T8-9  Needle:3.5 in., 22 ga.  Short bevel or Quincke spinal needle  Needle Placement: Transforaminal  Findings:    -Comments: Excellent flow of contrast along the nerve, nerve root and into the epidural space.  Procedure Details: After squaring off the end-plates of the desired vertebral level to get a true AP view, the C-arm was obliqued to the painful side so that the superior articulating process is positioned about 1/3 the length inferior endplate.  The needle should be aimed toward the junction of the superior articular process and the transverse process of the inferior vertebrae. The needle's initial entry is in the lower third of the foramen or "low in the hole." The soft tissues overlying this target were infiltrated with 2-3 ml. of 1% Lidocaine  without Epinephrine.  The spinal needle was then inserted and advanced toward the target using a "trajectory" view along the fluoroscope beam.  Under AP and lateral visualization, the needle was advanced so it did not puncture dura and did not traverse medially beyond the 6 o'clock position of the pedicle and just the SAP on lateral view. Bi-planar projections were used to confirm position.  Aspiration was confirmed to be negative for CSF and/or blood. A 1-2 ml. volume of Isovue-250 was injected and flow of contrast was noted at each level. Radiographs were obtained for documentation purposes.   After attaining the desired flow of contrast documented above, a 0.5 to 1.0 ml test dose of 0.25% Marcaine was injected into each respective transforaminal space.  The patient was observed for 90 seconds post injection.  After no sensory deficits were reported, and normal lower extremity motor function was noted,   the above injectate was administered so that equal amounts of the injectate were placed at each foramen (level) into the transforaminal epidural space.   Additional Comments:  No complications occurred Dressing: Band-Aid    Post-procedure details: Patient was observed during the procedure. Post-procedure instructions were reviewed. Follow-Up Instructions: Patient handout Patient left the clinic in stable condition.

## 2023-07-05 ENCOUNTER — Telehealth: Payer: Self-pay | Admitting: Physical Medicine and Rehabilitation

## 2023-07-05 NOTE — Telephone Encounter (Signed)
 Patient called. The injection did not work.

## 2023-08-30 ENCOUNTER — Other Ambulatory Visit (INDEPENDENT_AMBULATORY_CARE_PROVIDER_SITE_OTHER): Payer: Self-pay

## 2023-08-30 ENCOUNTER — Ambulatory Visit: Admitting: Orthopedic Surgery

## 2023-08-30 DIAGNOSIS — M545 Low back pain, unspecified: Secondary | ICD-10-CM

## 2023-08-30 MED ORDER — METHYLPREDNISOLONE 4 MG PO TBPK: ORAL_TABLET | ORAL | 0 refills | Status: AC

## 2023-08-30 NOTE — Progress Notes (Signed)
 Orthopedic Spine Surgery Office Note   Assessment: Patient is a 58 y.o. female who comes for recent onset of low back pain that radiates into the right posterior thigh, suspect radiculopathy     Plan: -Patient has tried methocarbamol , hydrocodone  -Prescribed a medrol  dose pak. She can use tylenol  up to 1000mg  TID for additional pain relief -If she is not doing any better in 6 weeks, she will let me know and I will order an MRI to evaluate for radiculopathy -Patient would need to be nicotine free prior to any elective spine surgery -Patient should return to office on an as needed basis     Patient expressed understanding of the plan and all questions were answered to the patient's satisfaction.    ___________________________________________________________________________     History:   Patient is a 58 y.o. female had previously been seen in the office for her thoracic spine but comes in today to talk her lumbar spine.  Patient states for the last week she has had low back pain that radiates into the right posterior thigh.  She said the pain has been severe especially when she is more active at work.  She does not have any pain rating past the knee on that side.  No pain rating in the left lower extremity.  There is no trauma or injury that preceded the onset of the pain.  No bowel or bladder incontinence.  No saddle anesthesia.     Physical Exam:   General: no acute distress, appears stated age Neurologic: alert, answering questions appropriately, following commands Respiratory: unlabored breathing on room air, symmetric chest rise Psychiatric: appropriate affect, normal cadence to speech     MSK (spine):   -Strength exam                                                   Left                  Right EHL                              5/5                  5/5 TA                                 5/5                  5/5 GSC                             5/5                  5/5 Knee  extension            5/5                  5/5 Hip flexion                    5/5                  5/5   -Sensory exam  Sensation intact to light touch in L3-S1 nerve distributions of bilateral lower extremities    Imaging: XRs of the lumbar spine from 08/30/2023 were independently reviewed and interpreted, showing no significant degenerative changes.  Lordotic alignment.  No evidence of instability on flexion/extension views.  No fracture or dislocation seen.     Patient name: Mercedes Stevens Patient MRN: [id number] Date of visit: 08/30/23

## 2023-09-04 ENCOUNTER — Ambulatory Visit: Admitting: Orthopedic Surgery

## 2023-10-11 ENCOUNTER — Encounter: Payer: Self-pay | Admitting: Orthopedic Surgery

## 2023-10-11 DIAGNOSIS — M545 Low back pain, unspecified: Secondary | ICD-10-CM

## 2023-10-18 ENCOUNTER — Ambulatory Visit: Admission: RE | Admit: 2023-10-18 | Source: Ambulatory Visit

## 2023-10-18 DIAGNOSIS — R0602 Shortness of breath: Secondary | ICD-10-CM | POA: Diagnosis not present

## 2023-10-18 DIAGNOSIS — U071 COVID-19: Secondary | ICD-10-CM | POA: Diagnosis not present

## 2023-10-20 DIAGNOSIS — R0602 Shortness of breath: Secondary | ICD-10-CM | POA: Diagnosis not present

## 2023-10-20 DIAGNOSIS — J189 Pneumonia, unspecified organism: Secondary | ICD-10-CM | POA: Diagnosis not present

## 2023-10-25 DIAGNOSIS — J069 Acute upper respiratory infection, unspecified: Secondary | ICD-10-CM | POA: Diagnosis not present

## 2023-10-25 DIAGNOSIS — F17200 Nicotine dependence, unspecified, uncomplicated: Secondary | ICD-10-CM | POA: Diagnosis not present

## 2023-10-25 DIAGNOSIS — R0602 Shortness of breath: Secondary | ICD-10-CM | POA: Diagnosis not present

## 2023-11-27 ENCOUNTER — Encounter: Payer: Self-pay | Admitting: Radiology
